# Patient Record
Sex: Female | Born: 1935 | Race: White | Hispanic: No | Marital: Married | Smoking: Never smoker
Health system: Southern US, Community
[De-identification: ages and names within clinical notes are randomized; demographics above are authoritative.]

## PROBLEM LIST (undated history)

## (undated) DIAGNOSIS — L719 Rosacea, unspecified: Secondary | ICD-10-CM

## (undated) DIAGNOSIS — R7303 Prediabetes: Secondary | ICD-10-CM

## (undated) DIAGNOSIS — R002 Palpitations: Secondary | ICD-10-CM

## (undated) DIAGNOSIS — M8580 Other specified disorders of bone density and structure, unspecified site: Secondary | ICD-10-CM

## (undated) DIAGNOSIS — E785 Hyperlipidemia, unspecified: Secondary | ICD-10-CM

## (undated) DIAGNOSIS — I10 Essential (primary) hypertension: Secondary | ICD-10-CM

## (undated) DIAGNOSIS — M4005 Postural kyphosis, thoracolumbar region: Secondary | ICD-10-CM

## (undated) DIAGNOSIS — R609 Edema, unspecified: Secondary | ICD-10-CM

## (undated) DIAGNOSIS — N189 Chronic kidney disease, unspecified: Secondary | ICD-10-CM

## (undated) DIAGNOSIS — H409 Unspecified glaucoma: Secondary | ICD-10-CM

## (undated) DIAGNOSIS — M47816 Spondylosis without myelopathy or radiculopathy, lumbar region: Secondary | ICD-10-CM

## (undated) DIAGNOSIS — E559 Vitamin D deficiency, unspecified: Secondary | ICD-10-CM

## (undated) DIAGNOSIS — N200 Calculus of kidney: Secondary | ICD-10-CM

## (undated) HISTORY — DX: Spondylosis without myelopathy or radiculopathy, lumbar region: M47.816

## (undated) HISTORY — DX: Essential (primary) hypertension: I10

## (undated) HISTORY — DX: Prediabetes: R73.03

## (undated) HISTORY — DX: Palpitations: R00.2

## (undated) HISTORY — DX: Unspecified glaucoma: H40.9

## (undated) HISTORY — DX: Postural kyphosis, thoracolumbar region: M40.05

## (undated) HISTORY — DX: Hyperlipidemia, unspecified: E78.5

## (undated) HISTORY — DX: Vitamin D deficiency, unspecified: E55.9

## (undated) HISTORY — DX: Edema, unspecified: R60.9

## (undated) HISTORY — DX: Calculus of kidney: N20.0

## (undated) HISTORY — PX: ABDOMINAL HYSTERECTOMY: SHX81

## (undated) HISTORY — DX: Chronic kidney disease, unspecified: N18.9

## (undated) HISTORY — DX: Other specified disorders of bone density and structure, unspecified site: M85.80

## (undated) HISTORY — DX: Rosacea, unspecified: L71.9

---

## 2003-08-09 ENCOUNTER — Other Ambulatory Visit: Admission: RE | Admit: 2003-08-09 | Discharge: 2003-08-09 | Payer: Self-pay | Admitting: *Deleted

## 2004-10-09 ENCOUNTER — Other Ambulatory Visit: Admission: RE | Admit: 2004-10-09 | Discharge: 2004-10-09 | Payer: Self-pay | Admitting: *Deleted

## 2006-02-10 HISTORY — PX: COLON SURGERY: SHX602

## 2006-10-07 ENCOUNTER — Other Ambulatory Visit: Admission: RE | Admit: 2006-10-07 | Discharge: 2006-10-07 | Payer: Self-pay | Admitting: *Deleted

## 2009-11-23 ENCOUNTER — Encounter: Payer: Self-pay | Admitting: Cardiology

## 2009-12-18 ENCOUNTER — Ambulatory Visit: Payer: Self-pay

## 2009-12-18 ENCOUNTER — Ambulatory Visit: Payer: Self-pay | Admitting: Cardiology

## 2009-12-18 DIAGNOSIS — R9430 Abnormal result of cardiovascular function study, unspecified: Secondary | ICD-10-CM | POA: Insufficient documentation

## 2010-01-07 ENCOUNTER — Telehealth (INDEPENDENT_AMBULATORY_CARE_PROVIDER_SITE_OTHER): Payer: Self-pay | Admitting: *Deleted

## 2010-01-08 ENCOUNTER — Ambulatory Visit: Payer: Self-pay

## 2010-01-08 ENCOUNTER — Ambulatory Visit: Payer: Self-pay | Admitting: Cardiology

## 2010-01-08 ENCOUNTER — Ambulatory Visit (HOSPITAL_COMMUNITY): Admission: RE | Admit: 2010-01-08 | Discharge: 2010-01-08 | Payer: Self-pay | Admitting: Cardiology

## 2010-01-08 ENCOUNTER — Encounter: Payer: Self-pay | Admitting: Cardiology

## 2010-03-14 NOTE — Progress Notes (Signed)
Summary: Stress Echo pre procedure  Phone Note Outgoing Call Call back at Home Phone [phone]   Call placed by: [employer]a [employer], CMA,  January 07, 2010 4:51 PM Call placed to: Patient Summary of Call: Called and left message with instructions for Stress Echo on 01/08/10 @ 8:30 a.m. There is no 919 area code for [place], [place].

## 2010-05-09 NOTE — Letter (Signed)
Summary: [facility] Office Visit Note   [facility] Office Visit Note   Imported By: Roderic Ovens 05/01/2010 11:07:59  _____________________________________________________________________  External Attachment:    Type:   Image     Comment:   External Document

## 2011-05-27 DIAGNOSIS — Z79899 Other long term (current) drug therapy: Secondary | ICD-10-CM | POA: Diagnosis not present

## 2011-05-27 DIAGNOSIS — I10 Essential (primary) hypertension: Secondary | ICD-10-CM | POA: Diagnosis not present

## 2011-05-27 DIAGNOSIS — E7800 Pure hypercholesterolemia, unspecified: Secondary | ICD-10-CM | POA: Diagnosis not present

## 2011-05-27 DIAGNOSIS — R7309 Other abnormal glucose: Secondary | ICD-10-CM | POA: Diagnosis not present

## 2011-05-27 DIAGNOSIS — R609 Edema, unspecified: Secondary | ICD-10-CM | POA: Diagnosis not present

## 2011-11-04 DIAGNOSIS — R002 Palpitations: Secondary | ICD-10-CM | POA: Diagnosis not present

## 2011-11-04 DIAGNOSIS — I10 Essential (primary) hypertension: Secondary | ICD-10-CM | POA: Diagnosis not present

## 2011-11-22 DIAGNOSIS — H5200 Hypermetropia, unspecified eye: Secondary | ICD-10-CM | POA: Diagnosis not present

## 2011-11-22 DIAGNOSIS — H04129 Dry eye syndrome of unspecified lacrimal gland: Secondary | ICD-10-CM | POA: Diagnosis not present

## 2011-11-26 DIAGNOSIS — E7800 Pure hypercholesterolemia, unspecified: Secondary | ICD-10-CM | POA: Diagnosis not present

## 2011-11-26 DIAGNOSIS — I10 Essential (primary) hypertension: Secondary | ICD-10-CM | POA: Diagnosis not present

## 2011-11-26 DIAGNOSIS — H6120 Impacted cerumen, unspecified ear: Secondary | ICD-10-CM | POA: Diagnosis not present

## 2011-11-26 DIAGNOSIS — Z23 Encounter for immunization: Secondary | ICD-10-CM | POA: Diagnosis not present

## 2011-12-22 DIAGNOSIS — Z1231 Encounter for screening mammogram for malignant neoplasm of breast: Secondary | ICD-10-CM | POA: Diagnosis not present

## 2011-12-24 DIAGNOSIS — Z1212 Encounter for screening for malignant neoplasm of rectum: Secondary | ICD-10-CM | POA: Diagnosis not present

## 2011-12-24 DIAGNOSIS — Z124 Encounter for screening for malignant neoplasm of cervix: Secondary | ICD-10-CM | POA: Diagnosis not present

## 2011-12-24 DIAGNOSIS — Z01419 Encounter for gynecological examination (general) (routine) without abnormal findings: Secondary | ICD-10-CM | POA: Diagnosis not present

## 2011-12-24 DIAGNOSIS — Z130 Encounter for screening for diseases of the blood and blood-forming organs and certain disorders involving the immune mechanism: Secondary | ICD-10-CM | POA: Diagnosis not present

## 2012-05-26 DIAGNOSIS — R7309 Other abnormal glucose: Secondary | ICD-10-CM | POA: Diagnosis not present

## 2012-05-26 DIAGNOSIS — E7800 Pure hypercholesterolemia, unspecified: Secondary | ICD-10-CM | POA: Diagnosis not present

## 2012-05-26 DIAGNOSIS — Z1331 Encounter for screening for depression: Secondary | ICD-10-CM | POA: Diagnosis not present

## 2012-05-26 DIAGNOSIS — Z9181 History of falling: Secondary | ICD-10-CM | POA: Diagnosis not present

## 2012-05-26 DIAGNOSIS — Z6834 Body mass index (BMI) 34.0-34.9, adult: Secondary | ICD-10-CM | POA: Diagnosis not present

## 2012-05-26 DIAGNOSIS — I10 Essential (primary) hypertension: Secondary | ICD-10-CM | POA: Diagnosis not present

## 2012-11-01 DIAGNOSIS — Z86010 Personal history of colonic polyps: Secondary | ICD-10-CM | POA: Diagnosis not present

## 2012-11-01 DIAGNOSIS — K591 Functional diarrhea: Secondary | ICD-10-CM | POA: Diagnosis not present

## 2012-11-01 DIAGNOSIS — K5730 Diverticulosis of large intestine without perforation or abscess without bleeding: Secondary | ICD-10-CM | POA: Diagnosis not present

## 2012-11-01 DIAGNOSIS — Z800 Family history of malignant neoplasm of digestive organs: Secondary | ICD-10-CM | POA: Diagnosis not present

## 2012-11-25 DIAGNOSIS — Z1211 Encounter for screening for malignant neoplasm of colon: Secondary | ICD-10-CM | POA: Diagnosis not present

## 2012-11-25 DIAGNOSIS — D126 Benign neoplasm of colon, unspecified: Secondary | ICD-10-CM | POA: Diagnosis not present

## 2012-11-25 DIAGNOSIS — Z800 Family history of malignant neoplasm of digestive organs: Secondary | ICD-10-CM | POA: Diagnosis not present

## 2012-11-25 DIAGNOSIS — K5730 Diverticulosis of large intestine without perforation or abscess without bleeding: Secondary | ICD-10-CM | POA: Diagnosis not present

## 2012-11-25 DIAGNOSIS — K644 Residual hemorrhoidal skin tags: Secondary | ICD-10-CM | POA: Diagnosis not present

## 2012-11-25 DIAGNOSIS — D649 Anemia, unspecified: Secondary | ICD-10-CM | POA: Diagnosis not present

## 2012-11-25 DIAGNOSIS — Z980 Intestinal bypass and anastomosis status: Secondary | ICD-10-CM | POA: Diagnosis not present

## 2012-11-25 DIAGNOSIS — I10 Essential (primary) hypertension: Secondary | ICD-10-CM | POA: Diagnosis not present

## 2012-12-01 DIAGNOSIS — E7800 Pure hypercholesterolemia, unspecified: Secondary | ICD-10-CM | POA: Diagnosis not present

## 2012-12-01 DIAGNOSIS — Z23 Encounter for immunization: Secondary | ICD-10-CM | POA: Diagnosis not present

## 2012-12-01 DIAGNOSIS — I10 Essential (primary) hypertension: Secondary | ICD-10-CM | POA: Diagnosis not present

## 2012-12-01 DIAGNOSIS — R7309 Other abnormal glucose: Secondary | ICD-10-CM | POA: Diagnosis not present

## 2012-12-04 DIAGNOSIS — H5200 Hypermetropia, unspecified eye: Secondary | ICD-10-CM | POA: Diagnosis not present

## 2012-12-04 DIAGNOSIS — H04129 Dry eye syndrome of unspecified lacrimal gland: Secondary | ICD-10-CM | POA: Diagnosis not present

## 2012-12-23 DIAGNOSIS — Z1231 Encounter for screening mammogram for malignant neoplasm of breast: Secondary | ICD-10-CM | POA: Diagnosis not present

## 2012-12-23 DIAGNOSIS — M899 Disorder of bone, unspecified: Secondary | ICD-10-CM | POA: Diagnosis not present

## 2013-06-02 DIAGNOSIS — I10 Essential (primary) hypertension: Secondary | ICD-10-CM | POA: Diagnosis not present

## 2013-06-02 DIAGNOSIS — R7309 Other abnormal glucose: Secondary | ICD-10-CM | POA: Diagnosis not present

## 2013-06-02 DIAGNOSIS — N183 Chronic kidney disease, stage 3 unspecified: Secondary | ICD-10-CM | POA: Diagnosis not present

## 2013-06-02 DIAGNOSIS — Z68.41 Body mass index (BMI) pediatric, greater than or equal to 95th percentile for age: Secondary | ICD-10-CM | POA: Diagnosis not present

## 2013-06-02 DIAGNOSIS — Z9181 History of falling: Secondary | ICD-10-CM | POA: Diagnosis not present

## 2013-06-02 DIAGNOSIS — E7800 Pure hypercholesterolemia, unspecified: Secondary | ICD-10-CM | POA: Diagnosis not present

## 2013-06-02 DIAGNOSIS — Z1331 Encounter for screening for depression: Secondary | ICD-10-CM | POA: Diagnosis not present

## 2013-09-20 DIAGNOSIS — Z1212 Encounter for screening for malignant neoplasm of rectum: Secondary | ICD-10-CM | POA: Diagnosis not present

## 2013-09-20 DIAGNOSIS — Z01419 Encounter for gynecological examination (general) (routine) without abnormal findings: Secondary | ICD-10-CM | POA: Diagnosis not present

## 2013-12-06 DIAGNOSIS — E780 Pure hypercholesterolemia: Secondary | ICD-10-CM | POA: Diagnosis not present

## 2013-12-06 DIAGNOSIS — R7309 Other abnormal glucose: Secondary | ICD-10-CM | POA: Diagnosis not present

## 2013-12-06 DIAGNOSIS — Z23 Encounter for immunization: Secondary | ICD-10-CM | POA: Diagnosis not present

## 2013-12-06 DIAGNOSIS — I10 Essential (primary) hypertension: Secondary | ICD-10-CM | POA: Diagnosis not present

## 2013-12-06 DIAGNOSIS — N183 Chronic kidney disease, stage 3 (moderate): Secondary | ICD-10-CM | POA: Diagnosis not present

## 2013-12-06 DIAGNOSIS — R609 Edema, unspecified: Secondary | ICD-10-CM | POA: Diagnosis not present

## 2014-05-29 DIAGNOSIS — Z803 Family history of malignant neoplasm of breast: Secondary | ICD-10-CM | POA: Diagnosis not present

## 2014-05-29 DIAGNOSIS — Z1231 Encounter for screening mammogram for malignant neoplasm of breast: Secondary | ICD-10-CM | POA: Diagnosis not present

## 2014-06-12 DIAGNOSIS — I10 Essential (primary) hypertension: Secondary | ICD-10-CM | POA: Diagnosis not present

## 2014-06-12 DIAGNOSIS — E780 Pure hypercholesterolemia: Secondary | ICD-10-CM | POA: Diagnosis not present

## 2014-06-12 DIAGNOSIS — Z6835 Body mass index (BMI) 35.0-35.9, adult: Secondary | ICD-10-CM | POA: Diagnosis not present

## 2014-06-12 DIAGNOSIS — R7309 Other abnormal glucose: Secondary | ICD-10-CM | POA: Diagnosis not present

## 2014-06-12 DIAGNOSIS — N183 Chronic kidney disease, stage 3 (moderate): Secondary | ICD-10-CM | POA: Diagnosis not present

## 2014-06-12 DIAGNOSIS — R609 Edema, unspecified: Secondary | ICD-10-CM | POA: Diagnosis not present

## 2014-12-13 DIAGNOSIS — E7800 Pure hypercholesterolemia, unspecified: Secondary | ICD-10-CM | POA: Diagnosis not present

## 2014-12-13 DIAGNOSIS — D485 Neoplasm of uncertain behavior of skin: Secondary | ICD-10-CM | POA: Diagnosis not present

## 2014-12-13 DIAGNOSIS — Z23 Encounter for immunization: Secondary | ICD-10-CM | POA: Diagnosis not present

## 2014-12-13 DIAGNOSIS — Z1389 Encounter for screening for other disorder: Secondary | ICD-10-CM | POA: Diagnosis not present

## 2014-12-13 DIAGNOSIS — I499 Cardiac arrhythmia, unspecified: Secondary | ICD-10-CM | POA: Diagnosis not present

## 2014-12-13 DIAGNOSIS — N183 Chronic kidney disease, stage 3 (moderate): Secondary | ICD-10-CM | POA: Diagnosis not present

## 2014-12-13 DIAGNOSIS — Z9181 History of falling: Secondary | ICD-10-CM | POA: Diagnosis not present

## 2014-12-13 DIAGNOSIS — Z6834 Body mass index (BMI) 34.0-34.9, adult: Secondary | ICD-10-CM | POA: Diagnosis not present

## 2014-12-13 DIAGNOSIS — R7303 Prediabetes: Secondary | ICD-10-CM | POA: Diagnosis not present

## 2014-12-13 DIAGNOSIS — I10 Essential (primary) hypertension: Secondary | ICD-10-CM | POA: Diagnosis not present

## 2014-12-13 DIAGNOSIS — M8580 Other specified disorders of bone density and structure, unspecified site: Secondary | ICD-10-CM | POA: Diagnosis not present

## 2014-12-26 DIAGNOSIS — D485 Neoplasm of uncertain behavior of skin: Secondary | ICD-10-CM | POA: Diagnosis not present

## 2014-12-26 DIAGNOSIS — L821 Other seborrheic keratosis: Secondary | ICD-10-CM | POA: Diagnosis not present

## 2015-02-13 DIAGNOSIS — I10 Essential (primary) hypertension: Secondary | ICD-10-CM | POA: Diagnosis not present

## 2015-02-13 DIAGNOSIS — R0789 Other chest pain: Secondary | ICD-10-CM | POA: Diagnosis not present

## 2015-02-13 DIAGNOSIS — Z6835 Body mass index (BMI) 35.0-35.9, adult: Secondary | ICD-10-CM | POA: Diagnosis not present

## 2015-02-24 DIAGNOSIS — H5203 Hypermetropia, bilateral: Secondary | ICD-10-CM | POA: Diagnosis not present

## 2015-02-24 DIAGNOSIS — H26493 Other secondary cataract, bilateral: Secondary | ICD-10-CM | POA: Diagnosis not present

## 2015-02-24 DIAGNOSIS — H04123 Dry eye syndrome of bilateral lacrimal glands: Secondary | ICD-10-CM | POA: Diagnosis not present

## 2015-03-02 ENCOUNTER — Telehealth: Payer: Self-pay | Admitting: Cardiology

## 2015-03-02 NOTE — Telephone Encounter (Signed)
Received records from [facility] for appointment with Dr Stanford Breed on 03/12/15.  Records given to [facility] (medical records) for Dr Jacalyn Lefevre schedule on 03/12/15. lp

## 2015-03-09 NOTE — Progress Notes (Signed)
HPI: 82 year old female for evaluation of chest pain. Stress echocardiogram November 2011 normal. On New Year's Eve the patient awoke and then developed pain in her left subscapular area. The pain did not radiate. No associated symptoms. Not pleuritic or positional. Lasted 6 hours and resolved after lying on her stomach. She has had no symptoms since. She has dyspnea with more extreme activities but not routine activities. No orthopnea or PND. Occasional mild pedal edema. No exertional chest pain or syncope. Because of the above we were asked to evaluate.  Current Outpatient Prescriptions  Medication Sig Dispense Refill  . amLODipine (NORVASC) 5 MG tablet Take 5 mg by mouth daily.    [name] aspirin 81 MG tablet Take 81 mg by mouth daily.    [name] atorvastatin (LIPITOR) 10 MG tablet Take 10 mg by mouth daily.    . Biotin 1000 MCG tablet Take 1,000 mcg by mouth daily.    . calcium citrate-vitamin D (CITRACAL+D) 315-200 MG-UNIT tablet Take 1 tablet by mouth 2 (two) times daily.    . Cholecalciferol (VITAMIN D-3) 1000 units CAPS Take 1 capsule by mouth daily.    . furosemide (LASIX) 40 MG tablet Take 40 mg by mouth daily as needed.     [name] losartan (COZAAR) 100 MG tablet Take 100 mg by mouth daily.    . metoprolol (LOPRESSOR) 100 MG tablet Take 100 mg by mouth daily.    . Multiple Vitamins-Minerals (MULTIVITAMIN WITH MINERALS) tablet Take 1 tablet by mouth daily.     No current facility-administered medications for this visit.    No Known Allergies   Past Medical History  Diagnosis Date  . Chronic kidney disease     stage 3  . Hypertension   . Vitamin D deficiency   . Rosacea   . Edema   . Pre-diabetes   . Hyperlipidemia   . Osteopenia   . Calculus of kidney   . Postural kyphosis of lumbar region   . Glaucoma   . Lumbar spondylosis   . Palpitations     Past Surgical History  Procedure Laterality Date  . Abdominal hysterectomy    . Colon surgery  2008    mass removed sigmoid     Social History   Social History  . Marital Status: Married    Spouse Name: N/A  . Number of Children: 2  . Years of Education: N/A   Occupational History  . Not on file.   Social History Main Topics  . Smoking status: Never Smoker   . Smokeless tobacco: Not on file  . Alcohol Use: No  . Drug Use: Not on file  . Sexual Activity: Not on file   Other Topics Concern  . Not on file   Social History Narrative    Family History  Problem Relation Age of Onset  . CVA Mother   . Cancer Father   . Cancer Sister   . Heart attack Brother   . Colon cancer Brother   . Breast cancer Sister     ROS: no fevers or chills, productive cough, hemoptysis, dysphasia, odynophagia, melena, hematochezia, dysuria, hematuria, rash, seizure activity, orthopnea, PND, pedal edema, claudication. Remaining systems are negative.  Physical Exam:   Blood pressure 156/70, pulse 60, height 5\' 4"  (1.626 m), weight 200 lb 11.2 oz (91.037 kg).  General:  Well developed/obese in NAD Skin warm/dry Patient not depressed No peripheral clubbing Back-normal HEENT-normal/normal eyelids Neck supple/normal carotid upstroke bilaterally; no bruits; no JVD; no thyromegaly  chest - CTA/ normal expansion CV - RRR/normal S1 and S2; no murmurs, rubs or gallops;  PMI nondisplaced Abdomen -NT/ND, no HSM, no mass, + bowel sounds, no bruit 2+ femoral pulses, no bruits Ext-no edema, chords, 2+ DP Neuro-grossly nonfocal  ECG 12/13/2014-sinus rhythm, cannot rule out prior inferior infarct. 02/13/2015-sinus rhythm with no ST changes.

## 2015-03-12 ENCOUNTER — Encounter: Payer: Self-pay | Admitting: Cardiology

## 2015-03-12 ENCOUNTER — Ambulatory Visit (INDEPENDENT_AMBULATORY_CARE_PROVIDER_SITE_OTHER): Payer: Medicare Other | Admitting: Cardiology

## 2015-03-12 VITALS — BP 156/70 | HR 60 | Ht 64.0 in | Wt 200.7 lb

## 2015-03-12 DIAGNOSIS — I10 Essential (primary) hypertension: Secondary | ICD-10-CM | POA: Insufficient documentation

## 2015-03-12 DIAGNOSIS — E785 Hyperlipidemia, unspecified: Secondary | ICD-10-CM | POA: Diagnosis not present

## 2015-03-12 DIAGNOSIS — R079 Chest pain, unspecified: Secondary | ICD-10-CM | POA: Insufficient documentation

## 2015-03-12 DIAGNOSIS — R072 Precordial pain: Secondary | ICD-10-CM

## 2015-03-12 NOTE — Assessment & Plan Note (Signed)
Symptoms are somewhat atypical.We will plan a stress echocardiogram for risk stratification. Note she had a positive electrocardiographic response with stress previously and therefore will need imaging.

## 2015-03-12 NOTE — Patient Instructions (Signed)
Medication Instructions:   NO CHANGE  Testing/Procedures:  Your physician has requested that you have a stress echocardiogram. For further information please visit [email or web address]. Please follow instruction sheet as given.    Follow-Up:  Your physician recommends that you schedule a follow-up appointment in: AS NEEDED   Exercise Stress Echocardiogram An exercise stress echocardiogram is a heart (cardiac) test used to check the function of your heart. This test may also be called an exercise stress echocardiography or stress echo. This stress test will check how well your heart muscle and valves are working and determine if your heart muscle is getting enough blood. You will exercise on a treadmill to naturally increase or stress the functioning of your heart.  An echocardiogram uses sound waves (ultrasound) to produce an image of your heart. If your heart does not work normally, it may indicate coronary artery disease with poor coronary blood supply. The coronary arteries are the arteries that bring blood and oxygen to your heart. LET [facility] CARE PROVIDER KNOW ABOUT:  Any allergies you have.  All medicines you are taking, including vitamins, herbs, eye drops, creams, and over-the-counter medicines.  Previous problems you or members of your family have had with the use of anesthetics.  Any blood disorders you have.  Previous surgeries you have had.  Medical conditions you have.  Possibility of pregnancy, if this applies. RISKS AND COMPLICATIONS Generally, this is a safe procedure. However, as with any procedure, complications can occur. Possible complications can include:  You develop pain or pressure in the following areas:  Chest.  Jaw or neck.  Between your shoulder blades.  Radiating down your left arm.  Dizziness or lightheadedness.  Shortness of breath.  Increased or irregular heartbeat.  Nausea or vomiting.  Heart attack (rare). BEFORE THE  PROCEDURE  Avoid all forms of caffeine for 24 hours before your test or as directed by your health care provider. This includes coffee, tea (even decaffeinated tea), caffeinated sodas, chocolate, cocoa, and certain pain medicines.  Follow your health care provider's instructions regarding eating and drinking before the test.  Take your medicines as directed at regular times with water unless instructed otherwise. Exceptions may include:  If you have diabetes, ask how you are to take your insulin or pills. It is common to adjust insulin dosing the morning of the test.  If you are taking beta-blocker medicines, it is important to talk to your health care provider about these medicines well before the date of your test. Taking beta-blocker medicines may interfere with the test. In some cases, these medicines need to be changed or stopped 24 hours or more before the test.  If you wear a nitroglycerin patch, it may need to be removed prior to the test. Ask your health care provider if the patch should be removed before the test.  If you use an inhaler for any breathing condition, bring it with you to the test.  If you are an outpatient, bring a snack so you can eat right after the stress phase of the test.  Do not smoke for 4 hours prior to the test or as directed by your health care provider.  Wear loose-fitting clothes and comfortable shoes for the test. This test involves walking on a treadmill. PROCEDURE   Multiple electrodes will be put on your chest. If needed, small areas of your chest may be shaved to get better contact with the electrodes. Once the electrodes are attached to your body, multiple wires will be  attached to the electrodes, and your heart rate will be monitored.  You will have an echocardiogram done at rest.  To produce this image of your heart, gel is applied to your chest, and a wand-like tool (transducer) is moved over the chest. The transducer sends the sound waves  through the chest to create the moving images of your heart.  You may need an IV to receive a medication that improves the quality of the pictures.  You will then walk on a treadmill. The treadmill will be started at a slow pace. The treadmill speed and incline will gradually be increased to raise your heart rate.  At the peak of exercise, the treadmill will be stopped. You will lie down immediately on a bed so that a second echocardiogram can be done to visualize your heart's motion with exercise.  The test usually takes 30-60 minutes to complete. AFTER THE PROCEDURE  Your heart rate and blood pressure will be monitored after the test.  You may return to your normal schedule, including diet, activities, and medicines, unless your health care provider tells you otherwise.   This information is not intended to replace advice given to you by your health care provider. Make sure you discuss any questions you have with your health care provider.   Document Released: 02/01/2004 Document Revised: 02/01/2013 Document Reviewed: 10/04/2012 Elsevier Interactive Patient Education [employer].

## 2015-03-12 NOTE — Assessment & Plan Note (Signed)
Continue statin. 

## 2015-03-12 NOTE — Assessment & Plan Note (Signed)
Blood pressure is mildly elevated today. However she checks this routinely at home and brought readings today. It is typically controlled with a systolic in the [id number] range. Continue present medications and follow.

## 2015-03-19 ENCOUNTER — Encounter: Payer: Self-pay | Admitting: Cardiology

## 2015-03-29 ENCOUNTER — Ambulatory Visit (HOSPITAL_COMMUNITY): Payer: Medicare Other | Attending: Cardiology

## 2015-03-29 ENCOUNTER — Other Ambulatory Visit (HOSPITAL_COMMUNITY): Payer: Medicare Other

## 2015-03-29 ENCOUNTER — Ambulatory Visit (HOSPITAL_COMMUNITY): Payer: Medicare Other

## 2015-03-29 DIAGNOSIS — R072 Precordial pain: Secondary | ICD-10-CM | POA: Diagnosis not present

## 2015-03-29 DIAGNOSIS — Z8249 Family history of ischemic heart disease and other diseases of the circulatory system: Secondary | ICD-10-CM | POA: Insufficient documentation

## 2015-03-29 DIAGNOSIS — E785 Hyperlipidemia, unspecified: Secondary | ICD-10-CM | POA: Insufficient documentation

## 2015-03-29 DIAGNOSIS — N189 Chronic kidney disease, unspecified: Secondary | ICD-10-CM | POA: Insufficient documentation

## 2015-03-29 DIAGNOSIS — R079 Chest pain, unspecified: Secondary | ICD-10-CM | POA: Diagnosis present

## 2015-05-01 DIAGNOSIS — H26491 Other secondary cataract, right eye: Secondary | ICD-10-CM | POA: Diagnosis not present

## 2015-05-29 DIAGNOSIS — H26492 Other secondary cataract, left eye: Secondary | ICD-10-CM | POA: Diagnosis not present

## 2015-06-14 DIAGNOSIS — R5383 Other fatigue: Secondary | ICD-10-CM | POA: Diagnosis not present

## 2015-06-14 DIAGNOSIS — E7800 Pure hypercholesterolemia, unspecified: Secondary | ICD-10-CM | POA: Diagnosis not present

## 2015-06-14 DIAGNOSIS — G4762 Sleep related leg cramps: Secondary | ICD-10-CM | POA: Diagnosis not present

## 2015-06-14 DIAGNOSIS — N183 Chronic kidney disease, stage 3 (moderate): Secondary | ICD-10-CM | POA: Diagnosis not present

## 2015-06-14 DIAGNOSIS — I10 Essential (primary) hypertension: Secondary | ICD-10-CM | POA: Diagnosis not present

## 2015-06-14 DIAGNOSIS — Z6835 Body mass index (BMI) 35.0-35.9, adult: Secondary | ICD-10-CM | POA: Diagnosis not present

## 2015-06-14 DIAGNOSIS — R7303 Prediabetes: Secondary | ICD-10-CM | POA: Diagnosis not present

## 2015-06-14 DIAGNOSIS — E669 Obesity, unspecified: Secondary | ICD-10-CM | POA: Diagnosis not present

## 2015-06-14 DIAGNOSIS — E559 Vitamin D deficiency, unspecified: Secondary | ICD-10-CM | POA: Diagnosis not present

## 2015-06-21 DIAGNOSIS — M8589 Other specified disorders of bone density and structure, multiple sites: Secondary | ICD-10-CM | POA: Diagnosis not present

## 2015-06-21 DIAGNOSIS — Z1231 Encounter for screening mammogram for malignant neoplasm of breast: Secondary | ICD-10-CM | POA: Diagnosis not present

## 2015-06-21 DIAGNOSIS — Z8262 Family history of osteoporosis: Secondary | ICD-10-CM | POA: Diagnosis not present

## 2015-08-08 DIAGNOSIS — M549 Dorsalgia, unspecified: Secondary | ICD-10-CM | POA: Diagnosis not present

## 2015-08-08 DIAGNOSIS — Z6835 Body mass index (BMI) 35.0-35.9, adult: Secondary | ICD-10-CM | POA: Diagnosis not present

## 2015-08-10 DIAGNOSIS — B029 Zoster without complications: Secondary | ICD-10-CM | POA: Diagnosis not present

## 2015-08-10 DIAGNOSIS — Z6835 Body mass index (BMI) 35.0-35.9, adult: Secondary | ICD-10-CM | POA: Diagnosis not present

## 2015-09-06 DIAGNOSIS — B0229 Other postherpetic nervous system involvement: Secondary | ICD-10-CM | POA: Diagnosis not present

## 2015-09-06 DIAGNOSIS — Z6834 Body mass index (BMI) 34.0-34.9, adult: Secondary | ICD-10-CM | POA: Diagnosis not present

## 2015-10-09 DIAGNOSIS — Z23 Encounter for immunization: Secondary | ICD-10-CM | POA: Diagnosis not present

## 2015-10-09 DIAGNOSIS — B0229 Other postherpetic nervous system involvement: Secondary | ICD-10-CM | POA: Diagnosis not present

## 2015-10-09 DIAGNOSIS — Z6835 Body mass index (BMI) 35.0-35.9, adult: Secondary | ICD-10-CM | POA: Diagnosis not present

## 2015-11-21 DIAGNOSIS — Z23 Encounter for immunization: Secondary | ICD-10-CM | POA: Diagnosis not present

## 2015-11-21 DIAGNOSIS — Z6834 Body mass index (BMI) 34.0-34.9, adult: Secondary | ICD-10-CM | POA: Diagnosis not present

## 2015-11-21 DIAGNOSIS — B0229 Other postherpetic nervous system involvement: Secondary | ICD-10-CM | POA: Diagnosis not present

## 2015-12-03 DIAGNOSIS — Z800 Family history of malignant neoplasm of digestive organs: Secondary | ICD-10-CM | POA: Diagnosis not present

## 2015-12-03 DIAGNOSIS — K5730 Diverticulosis of large intestine without perforation or abscess without bleeding: Secondary | ICD-10-CM | POA: Diagnosis not present

## 2015-12-07 DIAGNOSIS — Z01419 Encounter for gynecological examination (general) (routine) without abnormal findings: Secondary | ICD-10-CM | POA: Diagnosis not present

## 2015-12-07 DIAGNOSIS — Z6834 Body mass index (BMI) 34.0-34.9, adult: Secondary | ICD-10-CM | POA: Diagnosis not present

## 2015-12-18 DIAGNOSIS — I10 Essential (primary) hypertension: Secondary | ICD-10-CM | POA: Diagnosis not present

## 2015-12-18 DIAGNOSIS — E559 Vitamin D deficiency, unspecified: Secondary | ICD-10-CM | POA: Diagnosis not present

## 2015-12-18 DIAGNOSIS — M8580 Other specified disorders of bone density and structure, unspecified site: Secondary | ICD-10-CM | POA: Diagnosis not present

## 2015-12-18 DIAGNOSIS — Z1389 Encounter for screening for other disorder: Secondary | ICD-10-CM | POA: Diagnosis not present

## 2015-12-18 DIAGNOSIS — E7800 Pure hypercholesterolemia, unspecified: Secondary | ICD-10-CM | POA: Diagnosis not present

## 2015-12-18 DIAGNOSIS — N183 Chronic kidney disease, stage 3 (moderate): Secondary | ICD-10-CM | POA: Diagnosis not present

## 2015-12-18 DIAGNOSIS — Z6834 Body mass index (BMI) 34.0-34.9, adult: Secondary | ICD-10-CM | POA: Diagnosis not present

## 2015-12-18 DIAGNOSIS — Z9181 History of falling: Secondary | ICD-10-CM | POA: Diagnosis not present

## 2015-12-18 DIAGNOSIS — R7303 Prediabetes: Secondary | ICD-10-CM | POA: Diagnosis not present

## 2015-12-18 DIAGNOSIS — B0229 Other postherpetic nervous system involvement: Secondary | ICD-10-CM | POA: Diagnosis not present

## 2016-01-01 DIAGNOSIS — Z86010 Personal history of colonic polyps: Secondary | ICD-10-CM | POA: Diagnosis not present

## 2016-01-01 DIAGNOSIS — D122 Benign neoplasm of ascending colon: Secondary | ICD-10-CM | POA: Diagnosis not present

## 2016-01-01 DIAGNOSIS — K5730 Diverticulosis of large intestine without perforation or abscess without bleeding: Secondary | ICD-10-CM | POA: Diagnosis not present

## 2016-01-01 DIAGNOSIS — D126 Benign neoplasm of colon, unspecified: Secondary | ICD-10-CM | POA: Diagnosis not present

## 2016-01-01 DIAGNOSIS — K635 Polyp of colon: Secondary | ICD-10-CM | POA: Diagnosis not present

## 2016-06-19 DIAGNOSIS — R7303 Prediabetes: Secondary | ICD-10-CM | POA: Diagnosis not present

## 2016-06-19 DIAGNOSIS — I10 Essential (primary) hypertension: Secondary | ICD-10-CM | POA: Diagnosis not present

## 2016-06-19 DIAGNOSIS — Z23 Encounter for immunization: Secondary | ICD-10-CM | POA: Diagnosis not present

## 2016-06-19 DIAGNOSIS — Z6835 Body mass index (BMI) 35.0-35.9, adult: Secondary | ICD-10-CM | POA: Diagnosis not present

## 2016-06-19 DIAGNOSIS — N183 Chronic kidney disease, stage 3 (moderate): Secondary | ICD-10-CM | POA: Diagnosis not present

## 2016-06-19 DIAGNOSIS — E7800 Pure hypercholesterolemia, unspecified: Secondary | ICD-10-CM | POA: Diagnosis not present

## 2016-06-19 DIAGNOSIS — B0229 Other postherpetic nervous system involvement: Secondary | ICD-10-CM | POA: Diagnosis not present

## 2016-07-08 DIAGNOSIS — L255 Unspecified contact dermatitis due to plants, except food: Secondary | ICD-10-CM | POA: Diagnosis not present

## 2016-12-29 DIAGNOSIS — G250 Essential tremor: Secondary | ICD-10-CM | POA: Diagnosis not present

## 2016-12-29 DIAGNOSIS — R609 Edema, unspecified: Secondary | ICD-10-CM | POA: Diagnosis not present

## 2016-12-29 DIAGNOSIS — R7303 Prediabetes: Secondary | ICD-10-CM | POA: Diagnosis not present

## 2016-12-29 DIAGNOSIS — Z6835 Body mass index (BMI) 35.0-35.9, adult: Secondary | ICD-10-CM | POA: Diagnosis not present

## 2016-12-29 DIAGNOSIS — I10 Essential (primary) hypertension: Secondary | ICD-10-CM | POA: Diagnosis not present

## 2016-12-29 DIAGNOSIS — E7800 Pure hypercholesterolemia, unspecified: Secondary | ICD-10-CM | POA: Diagnosis not present

## 2016-12-29 DIAGNOSIS — H6123 Impacted cerumen, bilateral: Secondary | ICD-10-CM | POA: Diagnosis not present

## 2016-12-29 DIAGNOSIS — N183 Chronic kidney disease, stage 3 (moderate): Secondary | ICD-10-CM | POA: Diagnosis not present

## 2017-01-27 ENCOUNTER — Encounter: Payer: Self-pay | Admitting: *Deleted

## 2017-02-04 ENCOUNTER — Encounter: Payer: Self-pay | Admitting: Physician Assistant

## 2017-02-04 ENCOUNTER — Ambulatory Visit (INDEPENDENT_AMBULATORY_CARE_PROVIDER_SITE_OTHER): Payer: Medicare Other | Admitting: Physician Assistant

## 2017-02-04 VITALS — BP 140/60 | HR 67 | Ht 65.0 in | Wt 197.0 lb

## 2017-02-04 DIAGNOSIS — N183 Chronic kidney disease, stage 3 unspecified: Secondary | ICD-10-CM

## 2017-02-04 DIAGNOSIS — I10 Essential (primary) hypertension: Secondary | ICD-10-CM

## 2017-02-04 DIAGNOSIS — R079 Chest pain, unspecified: Secondary | ICD-10-CM | POA: Diagnosis not present

## 2017-02-04 DIAGNOSIS — R002 Palpitations: Secondary | ICD-10-CM | POA: Diagnosis not present

## 2017-02-04 DIAGNOSIS — E785 Hyperlipidemia, unspecified: Secondary | ICD-10-CM

## 2017-02-04 NOTE — Patient Instructions (Signed)
Medication Instructions:   No changes  Labwork:   none  Testing/Procedures:  Your physician has requested that you have an echocardiogram. Echocardiography is a painless test that uses sound waves to create images of your heart. It provides your doctor with information about the size and shape of your heart and how well your heart's chambers and valves are working. This procedure takes approximately one hour. There are no restrictions for this procedure.   Follow-Up:  In 3 months with Dr. Stanford Breed  Call our office sooner if you have recurrent chest pain.   If you need a refill on your cardiac medications before your next appointment, please call your pharmacy.

## 2017-02-04 NOTE — Progress Notes (Signed)
Cardiology Office Note    Date:  02/06/2017   ID:  ADLEE PAAR, DOB Jun 24, 1931, MRN [id number]  PCP:  Cyndi Bender, PA-C  Cardiologist:  Dr. Stanford Breed  Chief Complaint  Patient presents with  . Follow-up    seen for Dr. Stanford Breed.    History of Present Illness:  Bridget Evans is a 83 y.o. female with PMH of CKD stage III, HTN, HLD and h/o palpitation.  She had a stress echo in November 2011 that was normal.  She developed chest pain on New Year's Eve and was seen in January 2017.  Subsequent repeat stress echo obtained on 03/29/2015 showed 1 mm J-point depression with upsloping ST segment depression at peak exercise, no angina, subsequent staged echo was normal, overall considered a normal study.  It was recommended for her to follow-up on as-needed basis.  Patient presents today for cardiology office visit.  She had another episode of chest tightness and palpitation.  This symptom is very similar to what she experienced before New Year 2017 prior to her normal stress echo.  The symptoms occurred at rest and lasted several hours before going away.  EKG obtained today continue to show chronic Q waves in lead III however no other ST-T wave abnormality.  She does have PACs on EKG.  After the episode of chest pain, she had weakness for a few days, the symptom eventually resolved and has not recurred since.  So far she only had one episode of chest pain and palpitation.  Since then she has been able to walk around without any issue.  She says she was under a lot of stress planning for family Christmas get together at the time when she has this symptom.  Given the reassuring stress echo in 2017 and solitary episode of symptom with no recurrence for the past 2-1/2 weeks, I recommended a regular echocardiogram to establish baseline function of the heart.  As long as echocardiogram shows normal pumping function without significant wall motion abnormality, I think it would be more reasonable to  observe for recurrence.  I did recommend her to increase activity level to see if she has any symptoms.  She has been instructed to contact cardiology if she does have recurrence of chest discomfort, if so she will need a stress test.   Past Medical History:  Diagnosis Date  . Calculus of kidney   . Chronic kidney disease    stage 3  . Edema   . Glaucoma   . Hyperlipidemia   . Hypertension   . Lumbar spondylosis   . Osteopenia   . Palpitations   . Postural kyphosis of lumbar region   . Pre-diabetes   . Rosacea   . Vitamin D deficiency     Past Surgical History:  Procedure Laterality Date  . ABDOMINAL HYSTERECTOMY    . COLON SURGERY  2008   mass removed sigmoid    Current Medications: Outpatient Medications Prior to Visit  Medication Sig Dispense Refill  . amLODipine (NORVASC) 5 MG tablet Take 5 mg by mouth daily.    [name] aspirin 81 MG tablet Take 81 mg by mouth daily.    [name] atorvastatin (LIPITOR) 10 MG tablet Take 10 mg by mouth daily.    . Biotin 1000 MCG tablet Take 1,000 mcg by mouth daily.    . calcium citrate-vitamin D (CITRACAL+D) 315-200 MG-UNIT tablet Take 1 tablet by mouth 2 (two) times daily.    . Cholecalciferol (VITAMIN D-3) 1000 units CAPS Take  1 capsule by mouth daily.    . furosemide (LASIX) 40 MG tablet Take 40 mg by mouth daily as needed.     [name] losartan (COZAAR) 100 MG tablet Take 100 mg by mouth daily.    . metoprolol (LOPRESSOR) 100 MG tablet Take 100 mg by mouth daily.    . Multiple Vitamins-Minerals (MULTIVITAMIN WITH MINERALS) tablet Take 1 tablet by mouth daily.     No facility-administered medications prior to visit.      Allergies:   Patient has no known allergies.   Social History   Socioeconomic History  . Marital status: Married    Spouse name: None  . Number of children: 2  . Years of education: None  . Highest education level: None  Social Needs  . Financial resource strain: None  . Food insecurity - worry: None  . Food insecurity -  inability: None  . Transportation needs - medical: None  . Transportation needs - non-medical: None  Occupational History  . None  Tobacco Use  . Smoking status: Never Smoker  . Smokeless tobacco: Never Used  Substance and Sexual Activity  . Alcohol use: No    Alcohol/week: 0.0 oz  . Drug use: None  . Sexual activity: None  Other Topics Concern  . None  Social History Narrative  . None     Family History:  The patient's family history includes Breast cancer in her sister; CVA in her mother; Cancer in her father and sister; Colon cancer in her brother; Heart attack in her brother.   ROS:   Please see the history of present illness.    ROS All other systems reviewed and are negative.   PHYSICAL EXAM:   VS:  BP 140/60   Pulse 67   Ht 5\' 5"  (1.651 m)   Wt 197 lb (89.4 kg)   BMI 32.78 kg/m    GEN: Well nourished, well developed, in no acute distress  HEENT: normal  Neck: no JVD, carotid bruits, or masses Cardiac: RRR; no murmurs, rubs, or gallops,no edema  Respiratory:  clear to auscultation bilaterally, normal work of breathing GI: soft, nontender, nondistended, + BS MS: no deformity or atrophy  Skin: warm and dry, no rash Neuro:  Alert and Oriented x 3, Strength and sensation are intact Psych: euthymic mood, full affect  Wt Readings from Last 3 Encounters:  02/04/17 197 lb (89.4 kg)  03/12/15 200 lb 11.2 oz (91 kg)      Studies/Labs Reviewed:   EKG:  EKG is ordered today.  The ekg ordered today demonstrates normal sinus rhythm with PACs  Recent Labs: No results found for requested labs within last 8760 hours.   Lipid Panel No results found for: CHOL, TRIG, HDL, CHOLHDL, VLDL, LDLCALC, LDLDIRECT  Additional studies/ records that were reviewed today include:   Stress echo 03/29/2015 - Stress ECG conclusions: The stress ECG was non-diagnostic. [facility]   scoring: exercise time of 4.5 min; 92mm of J point depression with   upsloping ST segment depression at peak  exercise; 1 mm of   horizontal ST segment depression which occurred in recovery; no   angina; resulting score is -1. This score predicts a moderate   risk of cardiac events. - Staged echo: Normal echo stress  Impressions:  - Normal study after maximal exercise.   ASSESSMENT:    1. Chest pain, unspecified type   2. Palpitation   3. Hypertension, essential   4. Hyperlipidemia, unspecified hyperlipidemia type   5. CKD (chronic  kidney disease), stage III ([facility])      PLAN:  In order of problems listed above:  1. Chest pain: Solitary episode of chest pain 2 weeks ago without further recurrence.  This is similar to her chest pain prior to the last years stress echo.  I will obtain a regular echo, as long as chest pain does not recur, I do not think we need to do any additional study.  The chest pain was associated with palpitation.  2. Hypertension: Pressure stable   3. Hyperlipidemia: On Lipitor 10 mg daily.  Will defer to primary care provider to obtain annual lipid panel  4. CKD stage III: Continue current therapy, Lasix only on a as needed basis.    Medication Adjustments/Labs and Tests Ordered: Current medicines are reviewed at length with the patient today.  Concerns regarding medicines are outlined above.  Medication changes, Labs and Tests ordered today are listed in the Patient Instructions below. Patient Instructions  Medication Instructions:   No changes  Labwork:   none  Testing/Procedures:  Your physician has requested that you have an echocardiogram. Echocardiography is a painless test that uses sound waves to create images of your heart. It provides your doctor with information about the size and shape of your heart and how well your heart's chambers and valves are working. This procedure takes approximately one hour. There are no restrictions for this procedure.   Follow-Up:  In 3 months with Dr. Stanford Breed  Call our office sooner if you have recurrent  chest pain.   If you need a refill on your cardiac medications before your next appointment, please call your pharmacy.    Hilbert Corrigan, [place]  02/06/2017 7:56 AM    [address], [place], [place]  [address] Phone: [phone]; Fax: [phone]

## 2017-02-12 ENCOUNTER — Other Ambulatory Visit: Payer: Self-pay

## 2017-02-12 ENCOUNTER — Ambulatory Visit (HOSPITAL_COMMUNITY): Payer: Medicare Other | Attending: Cardiology

## 2017-02-12 DIAGNOSIS — R079 Chest pain, unspecified: Secondary | ICD-10-CM | POA: Insufficient documentation

## 2017-02-12 DIAGNOSIS — E785 Hyperlipidemia, unspecified: Secondary | ICD-10-CM | POA: Insufficient documentation

## 2017-02-12 DIAGNOSIS — I77810 Thoracic aortic ectasia: Secondary | ICD-10-CM | POA: Diagnosis not present

## 2017-02-12 DIAGNOSIS — I119 Hypertensive heart disease without heart failure: Secondary | ICD-10-CM | POA: Diagnosis not present

## 2017-02-12 DIAGNOSIS — R002 Palpitations: Secondary | ICD-10-CM | POA: Diagnosis not present

## 2017-02-12 DIAGNOSIS — I083 Combined rheumatic disorders of mitral, aortic and tricuspid valves: Secondary | ICD-10-CM | POA: Insufficient documentation

## 2017-02-18 ENCOUNTER — Other Ambulatory Visit: Payer: Self-pay | Admitting: Physician Assistant

## 2017-02-18 NOTE — Progress Notes (Signed)
Normal pumping function of heart, mild leakage in aortic and mitral valve which does not need to be fixed. There does appears to be some aneurysmal enlargement of her aorta. Recommend "CT angio of chest aorta" to further assess.

## 2017-02-25 ENCOUNTER — Telehealth: Payer: Self-pay

## 2017-02-25 DIAGNOSIS — I712 Thoracic aortic aneurysm, without rupture, unspecified: Secondary | ICD-10-CM

## 2017-02-25 DIAGNOSIS — I7789 Other specified disorders of arteries and arterioles: Secondary | ICD-10-CM

## 2017-02-25 DIAGNOSIS — I719 Aortic aneurysm of unspecified site, without rupture: Secondary | ICD-10-CM

## 2017-02-25 NOTE — Telephone Encounter (Signed)
-----   Message from [place], [place] sent at 02/18/2017  5:50 PM EST ----- Normal pumping function of heart, mild leakage in aortic and mitral valve which does not need to be fixed. There does appears to be some aneurysmal enlargement of her aorta. Recommend "CT angio of chest aorta" to further assess.

## 2017-02-25 NOTE — Telephone Encounter (Signed)
Patient directly notified; informed her that the test would be sent to the pre-cert pool and then if test is approved a scheduler will contact her to set up test.

## 2017-04-28 ENCOUNTER — Other Ambulatory Visit: Payer: Self-pay | Admitting: *Deleted

## 2017-04-28 DIAGNOSIS — Z01812 Encounter for preprocedural laboratory examination: Secondary | ICD-10-CM

## 2017-04-28 DIAGNOSIS — Z79899 Other long term (current) drug therapy: Secondary | ICD-10-CM

## 2017-04-29 ENCOUNTER — Other Ambulatory Visit: Payer: Medicare Other | Admitting: *Deleted

## 2017-04-29 ENCOUNTER — Other Ambulatory Visit: Payer: Self-pay | Admitting: Cardiology

## 2017-04-29 DIAGNOSIS — Z79899 Other long term (current) drug therapy: Secondary | ICD-10-CM | POA: Diagnosis not present

## 2017-04-29 DIAGNOSIS — Z01812 Encounter for preprocedural laboratory examination: Secondary | ICD-10-CM | POA: Diagnosis not present

## 2017-04-29 NOTE — Progress Notes (Signed)
HPI: FU chest pain. Stress echocardiogram 2/17 normal. Seen for CP 12/18. Echo 1/19 showed normal LV function, grade 1 DD, mild AI, moderately dilated aortic root, mild MR and TR. CTA 3/19 showed fusiform aneurysmal dilatation of a sending aorta measuring 5.1 cm.  There was a right upper lobe pulmonary nodule and follow-up recommended 6-12 months. Since last seen, she has mild dyspnea on exertion.  No orthopnea, PND, pedal edema or recurrent chest pain.  She does not have exertional chest pain and no syncope.  Current Outpatient Medications  Medication Sig Dispense Refill  . amLODipine (NORVASC) 5 MG tablet Take 5 mg by mouth daily.    [name] aspirin 81 MG tablet Take 81 mg by mouth daily.    [name] atorvastatin (LIPITOR) 10 MG tablet Take 10 mg by mouth daily.    . Biotin 1000 MCG tablet Take 1,000 mcg by mouth daily.    . calcium citrate-vitamin D (CITRACAL+D) 315-200 MG-UNIT tablet Take 1 tablet by mouth 2 (two) times daily.    . Cholecalciferol (VITAMIN D-3) 1000 units CAPS Take 1 capsule by mouth daily.    . furosemide (LASIX) 40 MG tablet Take 40 mg by mouth daily as needed.     [name] losartan (COZAAR) 100 MG tablet Take 100 mg by mouth daily.    . metoprolol succinate (TOPROL-XL) 100 MG 24 hr tablet Take 100 mg by mouth daily. Take with or immediately following a meal.    . Multiple Vitamins-Minerals (MULTIVITAMIN WITH MINERALS) tablet Take 1 tablet by mouth daily.     No current facility-administered medications for this visit.      Past Medical History:  Diagnosis Date  . Calculus of kidney   . Chronic kidney disease    stage 3  . Edema   . Glaucoma   . Hyperlipidemia   . Hypertension   . Lumbar spondylosis   . Osteopenia   . Palpitations   . Postural kyphosis of lumbar region   . Pre-diabetes   . Rosacea   . Vitamin D deficiency     Past Surgical History:  Procedure Laterality Date  . ABDOMINAL HYSTERECTOMY    . COLON SURGERY  2008   mass removed sigmoid    Social  History   Socioeconomic History  . Marital status: Married    Spouse name: Not on file  . Number of children: 2  . Years of education: Not on file  . Highest education level: Not on file  Occupational History  . Not on file  Social Needs  . Financial resource strain: Not on file  . Food insecurity:    Worry: Not on file    Inability: Not on file  . Transportation needs:    Medical: Not on file    Non-medical: Not on file  Tobacco Use  . Smoking status: Never Smoker  . Smokeless tobacco: Never Used  Substance and Sexual Activity  . Alcohol use: No    Alcohol/week: 0.0 oz  . Drug use: Not on file  . Sexual activity: Not on file  Lifestyle  . Physical activity:    Days per week: Not on file    Minutes per session: Not on file  . Stress: Not on file  Relationships  . Social connections:    Talks on phone: Not on file    Gets together: Not on file    Attends religious service: Not on file    Active member of club or organization: Not  on file    Attends meetings of clubs or organizations: Not on file    Relationship status: Not on file  . Intimate partner violence:    Fear of current or ex partner: Not on file    Emotionally abused: Not on file    Physically abused: Not on file    Forced sexual activity: Not on file  Other Topics Concern  . Not on file  Social History Narrative  . Not on file    Family History  Problem Relation Age of Onset  . CVA Mother   . Cancer Father   . Cancer Sister   . Heart attack Brother   . Colon cancer Brother   . Breast cancer Sister     ROS: no fevers or chills, productive cough, hemoptysis, dysphasia, odynophagia, melena, hematochezia, dysuria, hematuria, rash, seizure activity, orthopnea, PND, pedal edema, claudication. Remaining systems are negative.  Physical Exam: Well-developed well-nourished in no acute distress.  Skin is warm and dry.  HEENT is normal.  Neck is supple.  Chest is clear to auscultation with normal  expansion.  Cardiovascular exam is regular rate and rhythm.  Abdominal exam nontender or distended. No masses palpated. Extremities show no edema. neuro grossly intact   A/P  1 thoracic aortic aneurysm-we will arrange follow-up CTA September 2019.  I will ask cardiothoracic surgery to review.  2 hypertension-blood pressure is elevated.  Increase amlodipine to 10 mg daily and follow.  3 hyperlipidemia-continue statin.  4 history of chest pain-no exertional symptoms and no recurrences.  Previous stress test negative.  We will follow for now.  5 pulmonary nodule-repeat CT in 6 months.  Kirk Ruths, MD

## 2017-04-30 LAB — BASIC METABOLIC PANEL
BUN / CREAT RATIO: 21 (ref 12–28)
BUN: 17 mg/dL (ref 8–27)
CHLORIDE: 104 mmol/L (ref 96–106)
CO2: 20 mmol/L (ref 20–29)
CREATININE: 0.8 mg/dL (ref 0.57–1.00)
Calcium: 10 mg/dL (ref 8.7–10.3)
GFR calc Af Amer: 78 mL/min/{1.73_m2} (ref >59)
GFR calc non Af Amer: 68 mL/min/{1.73_m2} (ref >59)
GLUCOSE: 90 mg/dL (ref 65–99)
Potassium: 5.1 mmol/L (ref 3.5–5.2)
SODIUM: 140 mmol/L (ref 134–144)

## 2017-05-01 ENCOUNTER — Encounter: Payer: Self-pay | Admitting: *Deleted

## 2017-05-05 ENCOUNTER — Ambulatory Visit (INDEPENDENT_AMBULATORY_CARE_PROVIDER_SITE_OTHER)
Admission: RE | Admit: 2017-05-05 | Discharge: 2017-05-05 | Disposition: A | Discharge status: Home / Self Care | Payer: Medicare Other | Source: Ambulatory Visit | Attending: Physician Assistant | Admitting: Physician Assistant

## 2017-05-05 ENCOUNTER — Ambulatory Visit: Payer: Medicare Other | Admitting: Cardiology

## 2017-05-05 DIAGNOSIS — I7789 Other specified disorders of arteries and arterioles: Secondary | ICD-10-CM

## 2017-05-05 DIAGNOSIS — I700 Atherosclerosis of aorta: Secondary | ICD-10-CM | POA: Diagnosis not present

## 2017-05-05 DIAGNOSIS — I712 Thoracic aortic aneurysm, without rupture: Secondary | ICD-10-CM

## 2017-05-05 MED ORDER — IOPAMIDOL (ISOVUE-370) INJECTION 76%
100.0000 mL | Freq: Once | INTRAVENOUS | Status: AC | PRN
  Administered 2017-05-05: 100 mL via INTRAVENOUS

## 2017-05-08 ENCOUNTER — Encounter: Payer: Self-pay | Admitting: Cardiology

## 2017-05-08 ENCOUNTER — Ambulatory Visit (INDEPENDENT_AMBULATORY_CARE_PROVIDER_SITE_OTHER): Payer: Medicare Other | Admitting: Cardiology

## 2017-05-08 VITALS — BP 150/66 | HR 67 | Ht 64.0 in | Wt 203.4 lb

## 2017-05-08 DIAGNOSIS — E7800 Pure hypercholesterolemia, unspecified: Secondary | ICD-10-CM | POA: Diagnosis not present

## 2017-05-08 DIAGNOSIS — I712 Thoracic aortic aneurysm, without rupture, unspecified: Secondary | ICD-10-CM

## 2017-05-08 DIAGNOSIS — I10 Essential (primary) hypertension: Secondary | ICD-10-CM | POA: Diagnosis not present

## 2017-05-08 MED ORDER — AMLODIPINE BESYLATE 10 MG PO TABS: 10.0000 mg | ORAL_TABLET | Freq: Every day | ORAL | 3 refills | Status: DC

## 2017-05-08 NOTE — Patient Instructions (Signed)
Medication Instructions:   INCREASE AMLODIPINE TO 10 MG ONCE DAILY= 2 OF THE 5 MG TABLETS ONCE DAILY  Follow-Up:  Your physician recommends that you schedule a follow-up appointment in: [place]  If you need a refill on your cardiac medications before your next appointment, please call your pharmacy.

## 2017-06-10 ENCOUNTER — Encounter: Payer: Self-pay | Admitting: Surgery

## 2017-06-10 ENCOUNTER — Institutional Professional Consult (permissible substitution) (INDEPENDENT_AMBULATORY_CARE_PROVIDER_SITE_OTHER): Payer: Medicare Other | Admitting: Surgery

## 2017-06-10 VITALS — BP 160/72 | HR 76 | Resp 20 | Ht 64.0 in | Wt 200.0 lb

## 2017-06-10 DIAGNOSIS — I712 Thoracic aortic aneurysm, without rupture, unspecified: Secondary | ICD-10-CM

## 2017-06-10 NOTE — Progress Notes (Signed)
Cardiothoracic Surgery Consultation  PCP is Cyndi Bender, PA-C Referring Provider is Lelon Perla, MD  Chief Complaint  Patient presents with  . Thoracic Aortic Aneurysm    Surgical eval, CTA Chest 05/05/17, ECHO 02/12/17    HPI:  The patient is an 84 year old woman with a history of hypertension and hyperlipidemia who presented with an episode of chest pain in 02/2015 and had a stress echocardiogram in 03/2015 which was considered a normal study.  She had another episode of chest tightness and palpitation in December 2018 similar to what she had had a year before.  Electrocardiogram was unchanged.  She had a follow-up echocardiogram on 02/12/2017 which showed normal left ventricular systolic function with ejection fraction of 60 to 65%.  The aortic valve is trileaflet with mild thickening and calcification and mild regurgitation.  The ascending aorta was moderately dilated.  She subsequently had a CTA of the chest on 05/05/2017 which showed a 5.1 cm fusiform ascending aortic aneurysm as well as a solitary 8 x 6 mm right upper lobe pulmonary nodule.  She has had no further episodes of chest discomfort.   Past Medical History:  Diagnosis Date  . Calculus of kidney   . Chronic kidney disease    stage 3  . Edema   . Glaucoma   . Hyperlipidemia   . Hypertension   . Lumbar spondylosis   . Osteopenia   . Palpitations   . Postural kyphosis of lumbar region   . Pre-diabetes   . Rosacea   . Vitamin D deficiency     Past Surgical History:  Procedure Laterality Date  . ABDOMINAL HYSTERECTOMY    . COLON SURGERY  2008   mass removed sigmoid    Family History  Problem Relation Age of Onset  . CVA Mother   . Cancer Father   . Cancer Sister   . Heart attack Brother   . Colon cancer Brother   . Breast cancer Sister   No family history of known connective tissue disorder, aortic aneurysms, or aortic dissection.  Social History Social History   Tobacco Use  . Smoking status:  Never Smoker  . Smokeless tobacco: Never Used  Substance Use Topics  . Alcohol use: No    Alcohol/week: 0.0 oz  . Drug use: Not on file    Current Outpatient Medications  Medication Sig Dispense Refill  . amLODipine (NORVASC) 10 MG tablet Take 1 tablet (10 mg total) by mouth daily. 90 tablet 3  . aspirin 81 MG tablet Take 81 mg by mouth daily.    [name] atorvastatin (LIPITOR) 10 MG tablet Take 10 mg by mouth daily.    . Biotin 1000 MCG tablet Take 1,000 mcg by mouth daily.    . calcium citrate-vitamin D (CITRACAL+D) 315-200 MG-UNIT tablet Take 1 tablet by mouth 2 (two) times daily.    . Cholecalciferol (VITAMIN D-3) 1000 units CAPS Take 1 capsule by mouth daily.    . furosemide (LASIX) 40 MG tablet Take 40 mg by mouth daily as needed.     [name] losartan (COZAAR) 100 MG tablet Take 100 mg by mouth daily.    . metoprolol succinate (TOPROL-XL) 100 MG 24 hr tablet Take 100 mg by mouth daily. Take with or immediately following a meal.    . Multiple Vitamins-Minerals (MULTIVITAMIN WITH MINERALS) tablet Take 1 tablet by mouth daily.     No current facility-administered medications for this visit.     No Known Allergies  Review of  Systems  Constitutional: Positive for fatigue.  HENT: Negative.   Eyes: Negative.   Respiratory: Negative for shortness of breath.   Cardiovascular: Positive for leg swelling.  Gastrointestinal: Negative.   Endocrine: Negative.   Genitourinary: Negative.   Musculoskeletal: Negative.   Skin: Negative.   Allergic/Immunologic: Negative.   Neurological: Negative.   Hematological: Bruises/bleeds easily.  Psychiatric/Behavioral: Negative.     BP (!) 160/72   Pulse 76   Resp 20   Ht 5\' 4"  (1.626 m)   Wt 200 lb (90.7 kg)   SpO2 97% Comment: RA  BMI 34.33 kg/m  Physical Exam  Constitutional: She is oriented to person, place, and time. She appears well-developed and well-nourished. No distress.  HENT:  Head: Normocephalic and atraumatic.  Mouth/Throat:  Oropharynx is clear and moist.  Eyes: Pupils are equal, round, and reactive to light. Conjunctivae and EOM are normal.  Neck: Normal range of motion. Neck supple. No thyromegaly present.  Cardiovascular: Normal rate, regular rhythm, normal heart sounds and intact distal pulses.  No murmur heard. Pulmonary/Chest: Effort normal and breath sounds normal. No respiratory distress.  Abdominal: Soft. Bowel sounds are normal. She exhibits no distension. There is no tenderness.  Musculoskeletal: Normal range of motion. She exhibits no edema.  Lymphadenopathy:    She has no cervical adenopathy.  Neurological: She is alert and oriented to person, place, and time.  Skin: Skin is warm and dry.  Psychiatric: She has a normal mood and affect.     Diagnostic Tests:  CLINICAL DATA:  Ascending aortic enlargement seen on recent cardiac echo exam. No chest pain.  EXAM: CT ANGIOGRAPHY CHEST WITH CONTRAST  TECHNIQUE: Multidetector CT imaging of the chest was performed using the standard protocol during bolus administration of intravenous contrast. Multiplanar CT image reconstructions and MIPs were obtained to evaluate the vascular anatomy.  CONTRAST:  118mL ISOVUE-370 IOPAMIDOL (ISOVUE-370) INJECTION 76%  COMPARISON:  None.  FINDINGS: Cardiovascular: The heart is within normal limits in size for age. No pericardial effusion. There is fusiform aneurysmal dilatation of the ascending aorta with maximum diameter of 5.1 cm at the level of the right main pulmonary artery. No dissection. Normal caliber of the descending thoracic aorta. Minimal scattered calcifications at the aortic arch. The branch vessels are patent. Scattered coronary artery calcifications are noted.  The pulmonary arteries appear normal.  Mediastinum/Nodes: No mediastinal or hilar mass or adenopathy. The esophagus is grossly normal.  Lungs/Pleura: Solitary 8 x 6 mm right upper lobe pulmonary nodule. No other pulmonary  lesions are identified. No acute pulmonary findings. No pleural effusion.  Upper Abdomen: Patchy peripheral area of enhancement in segment 2 of the liver is most likely a vascular shunt. No other significant upper abdominal findings. Atherosclerotic calcifications involving the upper abdominal aorta.  Musculoskeletal: No breast masses, supraclavicular or axillary adenopathy. The thyroid gland is grossly normal. No significant bony findings. Moderate degenerative changes involving the thoracic spine along with a scoliosis.  Review of the MIP images confirms the above findings.  IMPRESSION: 1. Fusiform aneurysmal dilatation of the ascending aorta with maximum diameter of 5.1 cm. No dissection. Ascending thoracic aortic aneurysm. Recommend semi-annual imaging followup by CTA or MRA and referral to cardiothoracic surgery if not already obtained. This recommendation follows 2010 ACCF/AHA/AATS/ACR/ASA/SCA/SCAI/SIR/STS/SVM Guidelines for the Diagnosis and Management of Patients With Thoracic Aortic Disease. Circulation. 2010; 121: D664-Q034 2. Solitary 8 x 6 mm right upper lobe pulmonary nodule is indeterminate. Smoothly marginated and slightly lobulated without definite spiculation. Non-contrast chest CT at 6-12  months is recommended. If the nodule is stable at time of repeat CT, then future CT at 18-24 months (from today's scan) is considered optional for low-risk patients, but is recommended for high-risk patients. This recommendation follows the consensus statement: Guidelines for Management of Incidental Pulmonary Nodules Detected on CT Images: From the Fleischner Society 2017; Radiology 2017; 284:228-243. 3. Scattered aortic and coronary artery calcifications.  Aortic Atherosclerosis (ICD10-I70.0).   Electronically Signed   By: Marijo Sanes M.D.   On: 05/06/2017 08:53   *[facility]*                        [address] [place], [place] [address]                            [phone]  ------------------------------------------------------------------- Echocardiography  Patient:    Elliott, Quade MR #:       [id number] Study Date: 02/12/2017 Gender:     F Age:        54 Height:     165.1 cm Weight:     89.4 kg BSA:        2.06 m^2 Pt. Status: Room:   SONOGRAPHER  Walker, Outpatient  ATTENDING    Almyra Deforest [id number]  [id number]     CWCB, [place] [id number]  Memory Dance, Isaac Laud [id number]  cc:  ------------------------------------------------------------------- LV EF: 60% -   65%  ------------------------------------------------------------------- Indications:      R07.9 Chest pain.  ------------------------------------------------------------------- History:   PMH:  Palpitations. Postural kyphosis of lumbar region. Risk factors:  Hypertension. Dyslipidemia.  ------------------------------------------------------------------- Study Conclusions  - Left ventricle: The cavity size was normal. There was moderate   concentric hypertrophy. Systolic function was normal. The   estimated ejection fraction was in the range of 60% to 65%. Wall   motion was normal; there were no regional wall motion   abnormalities. Doppler parameters are consistent with abnormal   left ventricular relaxation (grade 1 diastolic dysfunction).   Doppler parameters are consistent with elevated ventricular   end-diastolic filling pressure. - Aortic valve: Trileaflet; mildly thickened, mildly calcified   leaflets. There was mild regurgitation. - Ascending aorta: The ascending aorta was moderately dilated. - Mitral valve: There was mild regurgitation. - Right ventricle: Systolic function was normal. - Right atrium: The atrium was normal in size. - Tricuspid valve: There was mild regurgitation. - Pulmonary arteries: Systolic pressure was within the normal   range. - Inferior vena cava: The  vessel was normal in size. - Pericardium, extracardiac: There was no pericardial effusion.  Impressions:  - Ascending aortic aneurysm with moderate dilatation and maximum   measured diameter 48 mm. No prior echo is available for   comparison. A dedicated CTA or MRA is recommended for further   evaluation.  ------------------------------------------------------------------- Study data:  Comparison was made to the study of 03/29/2015.  Study status:  Routine.  Procedure:  The patient reported no pain pre or post test. Transthoracic echocardiography. Image quality was adequate.  Study completion:  There were no complications. Echocardiography.  M-mode, complete 2D, spectral Doppler, and color Doppler.  Birthdate:  Patient birthdate: [birth year].  Age:  Patient is 84 yr old.  Sex:  Gender: female.    BMI: 32.8  kg/m^2.  Blood pressure:     146/79  Patient status:  Outpatient.  Study date: Study date: 02/12/2017. Study time: 03:51 PM.  Location:  [facility]  -------------------------------------------------------------------  ------------------------------------------------------------------- Left ventricle:  The cavity size was normal. There was moderate concentric hypertrophy. Systolic function was normal. The estimated ejection fraction was in the range of 60% to 65%. Wall motion was normal; there were no regional wall motion abnormalities. Doppler parameters are consistent with abnormal left ventricular relaxation (grade 1 diastolic dysfunction). Doppler parameters are consistent with elevated ventricular end-diastolic filling pressure.  ------------------------------------------------------------------- Aortic valve:   Trileaflet; mildly thickened, mildly calcified leaflets. Mobility was not restricted.  Doppler:  Transvalvular velocity was within the normal range. There was no stenosis. There was mild  regurgitation.  ------------------------------------------------------------------- Aorta:  Aortic root: The aortic root was normal in size. Ascending aorta: The ascending aorta was moderately dilated.  ------------------------------------------------------------------- Mitral valve:   Structurally normal valve.   Mobility was not restricted.  Doppler:  Transvalvular velocity was within the normal range. There was no evidence for stenosis. There was mild regurgitation.    Peak gradient (D): 3 mm Hg.  ------------------------------------------------------------------- Left atrium:  The atrium was normal in size.  ------------------------------------------------------------------- Right ventricle:  The cavity size was normal. Wall thickness was normal. Systolic function was normal.  ------------------------------------------------------------------- Pulmonic valve:    Structurally normal valve.   Cusp separation was normal.  Doppler:  Transvalvular velocity was within the normal range. There was no evidence for stenosis. There was no regurgitation.  ------------------------------------------------------------------- Tricuspid valve:   Structurally normal valve.    Doppler: Transvalvular velocity was within the normal range. There was mild regurgitation.  ------------------------------------------------------------------- Pulmonary artery:   The main pulmonary artery was normal-sized. Systolic pressure was within the normal range.  ------------------------------------------------------------------- Right atrium:  The atrium was normal in size.  ------------------------------------------------------------------- Pericardium:  There was no pericardial effusion.  ------------------------------------------------------------------- Systemic veins: Inferior vena cava: The vessel was normal in  size.  ------------------------------------------------------------------- Measurements   Left ventricle                           Value        Reference  LV ID, ED, PLAX chordal          (L)     42.1  mm     43 - 52  LV ID, ES, PLAX chordal                  27.4  mm     23 - 38  LV fx shortening, PLAX chordal           35    %      >=29  LV PW thickness, ED                      15.2  mm     ---------  IVS/LV PW ratio, ED                      1.01         <=1.3  LV e&', lateral                           7.13  cm/s   ---------  LV E/e&', lateral  12.95        ---------  LV e&', medial                            4.17  cm/s   ---------  LV E/e&', medial                          22.13        ---------  LV e&', average                           5.65  cm/s   ---------  LV E/e&', average                         16.34        ---------    Ventricular septum                       Value        Reference  IVS thickness, ED                        15.3  mm     ---------    LVOT                                     Value        Reference  LVOT ID, S                               19    mm     ---------  LVOT area                                2.84  cm^2   ---------    Aortic valve                             Value        Reference  Aortic regurg peak velocity              265   cm/s   ---------  Aortic regurg pressure half-time         455   ms     ---------  Aortic regurg peak gradient              28    mm Hg  ---------    Aorta                                    Value        Reference  Aortic root ID, ED                       34    mm     ---------    Left atrium                              Value  Reference  LA ID, A-P, ES                           35    mm     ---------  LA ID/bsa, A-P                           1.7   cm/m^2 <=2.2  LA volume, S                             57    ml     ---------  LA volume/bsa, S                         27.7  ml/m^2 ---------   LA volume, ES, 1-p A4C                   50    ml     ---------  LA volume/bsa, ES, 1-p A4C               24.3  ml/m^2 ---------  LA volume, ES, 1-p A2C                   63    ml     ---------  LA volume/bsa, ES, 1-p A2C               30.6  ml/m^2 ---------    Mitral valve                             Value        Reference  Mitral E-wave peak velocity              92.3  cm/s   ---------  Mitral A-wave peak velocity              93.3  cm/s   ---------  Mitral deceleration time                 165   ms     150 - 230  Mitral peak gradient, D                  3     mm Hg  ---------  Mitral E/A ratio, peak                   1            ---------    Pulmonary arteries                       Value        Reference  PA pressure, S, DP                       23    mm Hg  <=30    Tricuspid valve                          Value        Reference  Tricuspid regurg peak velocity           221   cm/s   ---------  Tricuspid peak RV-RA gradient  20    mm Hg  ---------    Systemic veins                           Value        Reference  Estimated CVP                            3     mm Hg  ---------    Right ventricle                          Value        Reference  RV pressure, S, DP                       23    mm Hg  <=30  RV s&', lateral, S                        13.3  cm/s   ---------  Legend: (L)  and  (H)  mark values outside specified reference range.  ------------------------------------------------------------------- Prepared and Electronically Authenticated by  Ena Dawley, M.D. 2019-01-04T08:12:44   Impression:  This 84 year old woman has a 5.1 cm fusiform ascending aortic aneurysm.  There is no sign of aortic dissection.  Her blood pressure is somewhat elevated today but she said she usually checks her blood pressure frequently at home and it is almost always less than 485'I systolic.  I stressed the importance of good blood pressure control with her.  I reviewed the  CTA images with her and her daughter-in-law and answered their questions.  I told her that she could continue to be active but should avoid doing heavy lifting of more than 35 pounds.  There is no contraindication to aerobic activity.  I doubt that this aneurysm is the cause of her prior chest discomfort.  She will require continued close follow-up of this and I have recommended doing a repeat CTA of the chest in 6 months.  She also has a 6 mm right upper lobe pulmonary nodule which will require follow-up and that can be done with the same CT scan in 6 months.   Plan:  I will see her back in 6 months with a CTA of the chest to follow-up on her ascending aortic aneurysm and right upper lobe pulmonary nodule.   I spent 30 minutes performing this consultation and > 50% of this time was spent face to face counseling and coordinating the care of this patient's ascending aortic aneurysm and right upper lobe pulmonary nodule.   Gaye Pollack, MD [facility] [phone]

## 2017-07-02 DIAGNOSIS — E7800 Pure hypercholesterolemia, unspecified: Secondary | ICD-10-CM | POA: Diagnosis not present

## 2017-07-02 DIAGNOSIS — G250 Essential tremor: Secondary | ICD-10-CM | POA: Diagnosis not present

## 2017-07-02 DIAGNOSIS — E559 Vitamin D deficiency, unspecified: Secondary | ICD-10-CM | POA: Diagnosis not present

## 2017-07-02 DIAGNOSIS — N183 Chronic kidney disease, stage 3 (moderate): Secondary | ICD-10-CM | POA: Diagnosis not present

## 2017-07-02 DIAGNOSIS — R609 Edema, unspecified: Secondary | ICD-10-CM | POA: Diagnosis not present

## 2017-07-02 DIAGNOSIS — I10 Essential (primary) hypertension: Secondary | ICD-10-CM | POA: Diagnosis not present

## 2017-10-19 ENCOUNTER — Encounter: Payer: Self-pay | Admitting: Cardiology

## 2017-10-26 NOTE — Progress Notes (Signed)
HPI: FU TAA. Stress echocardiogram 2/17 normal. Seen for CP 12/18. Echo 1/19 showed normal LV function, grade 1 DD, mild AI, moderately dilated aortic root, mild MR and TR. CTA 3/19 showed fusiform aneurysmal dilatation of ascending aorta measuring 5.1 cm.  There was a right upper lobe pulmonary nodule and follow-up recommended 6-12 months. Seen by Dr Cyndia Bent and FU CTs recommended. Since last seen,  there is no dyspnea, chest pain, palpitations or syncope.  Minimal pedal edema.  Current Outpatient Medications  Medication Sig Dispense Refill  . amLODipine (NORVASC) 10 MG tablet Take 1 tablet (10 mg total) by mouth daily. 90 tablet 3  . aspirin 81 MG tablet Take 81 mg by mouth daily.    [name] atorvastatin (LIPITOR) 10 MG tablet Take 10 mg by mouth daily.    . Biotin 1000 MCG tablet Take 1,000 mcg by mouth daily.    . calcium citrate-vitamin D (CITRACAL+D) 315-200 MG-UNIT tablet Take 1 tablet by mouth 2 (two) times daily.    . Cholecalciferol (VITAMIN D-3) 1000 units CAPS Take 1 capsule by mouth daily.    . furosemide (LASIX) 40 MG tablet Take 40 mg by mouth daily as needed.     [name] losartan (COZAAR) 100 MG tablet Take 100 mg by mouth daily.    . metoprolol succinate (TOPROL-XL) 100 MG 24 hr tablet Take 100 mg by mouth daily. Take with or immediately following a meal.    . Multiple Vitamins-Minerals (MULTIVITAMIN WITH MINERALS) tablet Take 1 tablet by mouth daily.     No current facility-administered medications for this visit.      Past Medical History:  Diagnosis Date  . Calculus of kidney   . Chronic kidney disease    stage 3  . Edema   . Glaucoma   . Hyperlipidemia   . Hypertension   . Lumbar spondylosis   . Osteopenia   . Palpitations   . Postural kyphosis of lumbar region   . Pre-diabetes   . Rosacea   . Vitamin D deficiency     Past Surgical History:  Procedure Laterality Date  . ABDOMINAL HYSTERECTOMY    . COLON SURGERY  2008   mass removed sigmoid    Social  History   Socioeconomic History  . Marital status: Married    Spouse name: Not on file  . Number of children: 2  . Years of education: Not on file  . Highest education level: Not on file  Occupational History  . Not on file  Social Needs  . Financial resource strain: Not on file  . Food insecurity:    Worry: Not on file    Inability: Not on file  . Transportation needs:    Medical: Not on file    Non-medical: Not on file  Tobacco Use  . Smoking status: Never Smoker  . Smokeless tobacco: Never Used  Substance and Sexual Activity  . Alcohol use: No    Alcohol/week: 0.0 standard drinks  . Drug use: Never  . Sexual activity: Not on file  Lifestyle  . Physical activity:    Days per week: Not on file    Minutes per session: Not on file  . Stress: Not on file  Relationships  . Social connections:    Talks on phone: Not on file    Gets together: Not on file    Attends religious service: Not on file    Active member of club or organization: Not on file  Attends meetings of clubs or organizations: Not on file    Relationship status: Not on file  . Intimate partner violence:    Fear of current or ex partner: Not on file    Emotionally abused: Not on file    Physically abused: Not on file    Forced sexual activity: Not on file  Other Topics Concern  . Not on file  Social History Narrative  . Not on file    Family History  Problem Relation Age of Onset  . CVA Mother   . Cancer Father   . Cancer Sister   . Heart attack Brother   . Colon cancer Brother   . Breast cancer Sister     ROS: Arthralgias but no fevers or chills, productive cough, hemoptysis, dysphasia, odynophagia, melena, hematochezia, dysuria, hematuria, rash, seizure activity, orthopnea, PND, claudication. Remaining systems are negative.  Physical Exam: Well-developed well-nourished in no acute distress.  Skin is warm and dry.  HEENT is normal.  Neck is supple.  Chest is clear to auscultation with  normal expansion.  Cardiovascular exam is regular rate and rhythm.  Abdominal exam nontender or distended. No masses palpated. Extremities show trace edema. neuro grossly intact   A/P  1 thoracic aortic aneurysm-Will arrange follow-up CTA of thoracic aorta in November and then follow-up with Dr. Cyndia Bent afterwards.    2 pulmonary nodules-follow-up CT in November as outlined above.  3 hypertension-blood pressure is elevated.  I will add HCTZ 12.5 mg daily.  Check potassium and renal function in 1 week.  Follow blood pressure and adjust regimen as needed.  4 hyperlipidemia-continue statin.  5 previous history of chest pain-no recurrent symptoms.  No further evaluation.  Kirk Ruths, MD

## 2017-11-02 ENCOUNTER — Ambulatory Visit (INDEPENDENT_AMBULATORY_CARE_PROVIDER_SITE_OTHER): Payer: Medicare Other | Admitting: Cardiology

## 2017-11-02 ENCOUNTER — Encounter: Payer: Self-pay | Admitting: Cardiology

## 2017-11-02 VITALS — BP 152/68 | HR 70 | Ht 64.0 in | Wt 201.0 lb

## 2017-11-02 DIAGNOSIS — I10 Essential (primary) hypertension: Secondary | ICD-10-CM | POA: Diagnosis not present

## 2017-11-02 DIAGNOSIS — E7800 Pure hypercholesterolemia, unspecified: Secondary | ICD-10-CM

## 2017-11-02 DIAGNOSIS — I712 Thoracic aortic aneurysm, without rupture, unspecified: Secondary | ICD-10-CM

## 2017-11-02 MED ORDER — HYDROCHLOROTHIAZIDE 12.5 MG PO CAPS: 12.5000 mg | ORAL_CAPSULE | Freq: Every day | ORAL | 3 refills | Status: DC

## 2017-11-02 NOTE — Patient Instructions (Signed)
Medication Instructions:   START HCTZ 12.5 MG ONCE DAILY  Labwork:  Your physician recommends that you return for lab work in: [place]  Testing/Procedures:  CTA OF THE CHEST W/WO AT [place] UP THORACIC ANEURYSM AND LUNG NODULE-SCHEDULE EARLY NOVEMBER  Follow-Up:  Your physician wants you to follow-up in: [place] will receive a reminder letter in the mail two months in advance. If you don't receive a letter, please call our office to schedule the follow-up appointment.   PLEASE ARRANGE FOLLOW UP WITH DR Cyndia Bent AFTER CTA COMPLETE  If you need a refill on your cardiac medications before your next appointment, please call your pharmacy.

## 2017-11-09 ENCOUNTER — Telehealth: Payer: Self-pay | Admitting: Cardiology

## 2017-11-09 NOTE — Telephone Encounter (Signed)
Left message for patient of dr crenshaw's recommendations. 

## 2017-11-09 NOTE — Telephone Encounter (Signed)
Ok to stop hctz and follow bp Kirk Ruths

## 2017-11-09 NOTE — Telephone Encounter (Signed)
New message   Pt c/o medication issue:  1. Name of Medication: hydrochlorothiazide (MICROZIDE) 12.5 MG capsule  2. How are you currently taking this medication (dosage and times per day)? 1 time daily  3. Are you having a reaction (difficulty breathing--STAT)? No   4. What is your medication issue? Patient states that she is having dizziness, and the patient states that it is not pulling the fluid out of her body like it should.

## 2017-11-09 NOTE — Telephone Encounter (Signed)
Pt called to report that she has started the HCTZ 12.5mg  last Wed 9/25 per Dr. Jacalyn Lefevre orders. She woke up Thurs 9/26 with some dizziness.. Bp 120/50 HR 65... She decided to hold the HCTZ and her BP has been running the same since then so she has not taken any more HCTZ and has not had any more dizziness. Today her BP is 120/60 and HR 70 so she is still holding her HCTZ.Marland KitchenPt is not going to have her BMET drawn since she has not had anymore of the HCTZ.. She is going to keep track of her BP twice a day and call us in a few days with the readings. I also advised her that I will let Dr. Stanford Breed know what is going on and let her know if he has any other recommendations. Pt verbalized understanding and agrees to keep track of her BP and call us if they tend to be elevated again.

## 2017-11-10 NOTE — Telephone Encounter (Signed)
Pt aware to monitor b/p and if notes trending upward to call office the patient agrees ..Bridget Evans

## 2017-11-13 ENCOUNTER — Other Ambulatory Visit: Payer: Self-pay | Admitting: *Deleted

## 2017-12-07 DIAGNOSIS — I712 Thoracic aortic aneurysm, without rupture: Secondary | ICD-10-CM | POA: Diagnosis not present

## 2017-12-08 ENCOUNTER — Encounter: Payer: Self-pay | Admitting: *Deleted

## 2017-12-08 LAB — BASIC METABOLIC PANEL
BUN / CREAT RATIO: 22 (ref 12–28)
BUN: 17 mg/dL (ref 8–27)
CO2: 26 mmol/L (ref 20–29)
CREATININE: 0.77 mg/dL (ref 0.57–1.00)
Calcium: 10 mg/dL (ref 8.7–10.3)
Chloride: 103 mmol/L (ref 96–106)
GFR calc non Af Amer: 71 mL/min/{1.73_m2} (ref >59)
GFR, EST AFRICAN AMERICAN: 82 mL/min/{1.73_m2} (ref >59)
GLUCOSE: 93 mg/dL (ref 65–99)
Potassium: 4.3 mmol/L (ref 3.5–5.2)
Sodium: 139 mmol/L (ref 134–144)

## 2017-12-15 ENCOUNTER — Ambulatory Visit (INDEPENDENT_AMBULATORY_CARE_PROVIDER_SITE_OTHER)
Admission: RE | Admit: 2017-12-15 | Discharge: 2017-12-15 | Disposition: A | Discharge status: Home / Self Care | Payer: Medicare Other | Source: Ambulatory Visit | Attending: Cardiology | Admitting: Cardiology

## 2017-12-15 DIAGNOSIS — I712 Thoracic aortic aneurysm, without rupture, unspecified: Secondary | ICD-10-CM

## 2017-12-15 MED ORDER — IOPAMIDOL (ISOVUE-370) INJECTION 76%
100.0000 mL | Freq: Once | INTRAVENOUS | Status: AC | PRN
  Administered 2017-12-15: 100 mL via INTRAVENOUS

## 2017-12-16 ENCOUNTER — Ambulatory Visit (INDEPENDENT_AMBULATORY_CARE_PROVIDER_SITE_OTHER): Payer: Medicare Other | Admitting: Surgery

## 2017-12-16 VITALS — BP 159/72 | HR 75 | Resp 20 | Ht 64.0 in | Wt 200.0 lb

## 2017-12-16 DIAGNOSIS — I712 Thoracic aortic aneurysm, without rupture, unspecified: Secondary | ICD-10-CM

## 2017-12-16 DIAGNOSIS — R911 Solitary pulmonary nodule: Secondary | ICD-10-CM | POA: Diagnosis not present

## 2017-12-17 ENCOUNTER — Encounter: Payer: Self-pay | Admitting: Surgery

## 2017-12-17 NOTE — Progress Notes (Signed)
HPI:  The patient returns for follow-up of a 5.1 cm fusiform ascending aortic aneurysm as well as a solitary 8 x 6 mm right upper lobe pulmonary nodule seen on CTA of the chest on 05/05/2017.  She had been found to have a dilated ascending aorta on echocardiogram done on 02/12/2017.  This showed a trileaflet aortic valve with mildly thickened and calcified leaflets with no restriction of mobility.  There is no evidence of stenosis and mild regurgitation.  Since I last saw her she has been feeling well and denies any chest pain or shortness of breath.  Current Outpatient Medications  Medication Sig Dispense Refill  . amLODipine (NORVASC) 10 MG tablet Take 1 tablet (10 mg total) by mouth daily. 90 tablet 3  . aspirin 81 MG tablet Take 81 mg by mouth daily.    [name] atorvastatin (LIPITOR) 10 MG tablet Take 10 mg by mouth daily.    . Biotin 1000 MCG tablet Take 1,000 mcg by mouth daily.    . calcium citrate-vitamin D (CITRACAL+D) 315-200 MG-UNIT tablet Take 1 tablet by mouth 2 (two) times daily.    . Cholecalciferol (VITAMIN D-3) 1000 units CAPS Take 1 capsule by mouth daily.    . furosemide (LASIX) 40 MG tablet Take 40 mg by mouth daily as needed.     [name] losartan (COZAAR) 100 MG tablet Take 100 mg by mouth daily.    . metoprolol succinate (TOPROL-XL) 100 MG 24 hr tablet Take 100 mg by mouth daily. Take with or immediately following a meal.    . Multiple Vitamins-Minerals (MULTIVITAMIN WITH MINERALS) tablet Take 1 tablet by mouth daily.     No current facility-administered medications for this visit.      Physical Exam: BP (!) 159/72   Pulse 75   Resp 20   Ht 5\' 4"  (1.626 m)   Wt 200 lb (90.7 kg)   SpO2 96% Comment: RA  BMI 34.33 kg/m  She looks well. Cardiac exam shows regular rate and rhythm with normal heart sounds.  There is no murmur. Lungs are clear. There is no peripheral edema.  Diagnostic Tests:  CLINICAL DATA:  Follow-up of aneurysmal disease of the ascending thoracic  aorta.  EXAM: CT ANGIOGRAPHY CHEST WITH CONTRAST  TECHNIQUE: Multidetector CT imaging of the chest was performed using the standard protocol during bolus administration of intravenous contrast. Multiplanar CT image reconstructions and MIPs were obtained to evaluate the vascular anatomy.  CONTRAST:  158mL ISOVUE-370 IOPAMIDOL (ISOVUE-370) INJECTION 76%  COMPARISON:  05/05/2017  FINDINGS: Cardiovascular: There is stable aneurysmal disease of the ascending thoracic aorta measuring approximately 5.2 cm in greatest diameter. The aortic root is normal in caliber at the level of the sinuses of Valsalva measuring approximately 3.8 cm. The proximal arch measures 4.0 cm. The distal arch measures 2.9 cm. The descending thoracic aorta measures 2.9 cm. No evidence of aortic dissection. Proximal great vessels show normal patency. 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  The heart size is stable and within normal limits. No pericardial fluid identified. Calcified coronary artery plaque again noted in the distribution of the LAD and left circumflex coronary arteries. Central pulmonary arteries are normal in caliber.  Mediastinum/Nodes: No enlarged mediastinal, hilar, or axillary lymph nodes. Thyroid gland, trachea, and esophagus demonstrate no significant findings.  Lungs/Pleura: Stable anterior right upper lobe lung nodule measuring approximately 7 mm. Stable additional nodularity at the lateral left lung base with 2  separate areas measuring roughly 5 mm and 6 mm respectively. Stable scarring at the left lung base. There is no evidence of pulmonary edema, consolidation, pneumothorax or pleural fluid.  Upper Abdomen: Stable hypervascular subcapsular arterial perfusion abnormality in the anterior left lobe of the liver.  Musculoskeletal: No chest wall abnormality. No acute or significant osseous  findings.  Review of the MIP images confirms the above findings.  IMPRESSION: 1. Stable aneurysmal disease of the ascending thoracic aorta measuring approximately 5.2 cm in greatest diameter. 2. 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 3. Stable coronary atherosclerosis with calcified plaque in the distribution of the LAD and left circumflex coronary arteries. 4. Stable 7 mm right upper lobe lung nodule. 5. Stable hypervascular subcapsular perfusion abnormality/shunt in the anterior left lobe of the liver. This appears likely benign.  Aortic aneurysm NOS (ICD10-I71.9).   Electronically Signed   By: Aletta Edouard M.D.   On: 12/15/2017 16:21  Impression:  I have personally reviewed her CTA of the chest.  She has a stable 5.2 cm fusiform ascending aortic aneurysm.  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  The ascending aortic aneurysm is still below the 5.5 cm surgical threshold and I would be very conservative in this 84 year old patient.  I stressed the importance of good blood pressure control.  Her blood pressure is moderately elevated today although she says when she checks it at home it is usually under control.  She feels like she has "white coat syndrome ".  She is on 3 antihypertensives including a beta-blocker as well as Lasix.  The right upper lobe nodule is stable at 7 mm.  I reviewed the CT images with her and answered all of her questions.  I recommended that she have a follow-up study in 6 months.  Plan:  She will return to see me in 6 months with a CTA of the chest.  I spent 15 minutes performing this established patient evaluation and > 50% of this time was spent face to face counseling and coordinating the care of this patient's aortic aneurysm and right upper lobe lung nodule.    Gaye Pollack, MD [facility] [phone]

## 2018-01-05 DIAGNOSIS — Z1339 Encounter for screening examination for other mental health and behavioral disorders: Secondary | ICD-10-CM | POA: Diagnosis not present

## 2018-01-05 DIAGNOSIS — Z9181 History of falling: Secondary | ICD-10-CM | POA: Diagnosis not present

## 2018-01-05 DIAGNOSIS — E7800 Pure hypercholesterolemia, unspecified: Secondary | ICD-10-CM | POA: Diagnosis not present

## 2018-01-05 DIAGNOSIS — I712 Thoracic aortic aneurysm, without rupture: Secondary | ICD-10-CM | POA: Diagnosis not present

## 2018-01-05 DIAGNOSIS — R609 Edema, unspecified: Secondary | ICD-10-CM | POA: Diagnosis not present

## 2018-01-05 DIAGNOSIS — I10 Essential (primary) hypertension: Secondary | ICD-10-CM | POA: Diagnosis not present

## 2018-01-05 DIAGNOSIS — N183 Chronic kidney disease, stage 3 (moderate): Secondary | ICD-10-CM | POA: Diagnosis not present

## 2018-01-05 DIAGNOSIS — Z1331 Encounter for screening for depression: Secondary | ICD-10-CM | POA: Diagnosis not present

## 2018-02-20 DIAGNOSIS — H52223 Regular astigmatism, bilateral: Secondary | ICD-10-CM | POA: Diagnosis not present

## 2018-02-20 DIAGNOSIS — H04123 Dry eye syndrome of bilateral lacrimal glands: Secondary | ICD-10-CM | POA: Diagnosis not present

## 2018-02-20 DIAGNOSIS — H5203 Hypermetropia, bilateral: Secondary | ICD-10-CM | POA: Diagnosis not present

## 2018-04-20 NOTE — Progress Notes (Signed)
HPI: FUTAA. Stress echocardiogram2/17normal. Seen for CP 12/18. Echo 1/19 showed normal LV function, grade 1 DD, mild AI, moderately dilated aortic root, mild MR and TR. CTA 3/19 showedfusiform aneurysmal dilatation of ascending aorta measuring 5.1 cm. There was a right upper lobe pulmonary nodule and follow-up recommended 6-12 months. Seen by Dr Cyndia Bent and FU CTs recommended.   Follow-up CTA November 2019 showed stable aneurysmal dilatation of the ascending aorta at 5.2 cm.  The innominate was dilated at 2 cm and the right upper lobe lung nodule was stable as well.  Since last seen,there is no dyspnea, chest pain, palpitations or syncope.  Current Outpatient Medications  Medication Sig Dispense Refill  . amLODipine (NORVASC) 10 MG tablet Take 1 tablet (10 mg total) by mouth daily. 90 tablet 3  . aspirin 81 MG tablet Take 81 mg by mouth daily.    [name] atorvastatin (LIPITOR) 10 MG tablet Take 10 mg by mouth daily.    . Biotin 1000 MCG tablet Take 1,000 mcg by mouth daily.    . calcium citrate-vitamin D (CITRACAL+D) 315-200 MG-UNIT tablet Take 1 tablet by mouth 2 (two) times daily.    . Cholecalciferol (VITAMIN D-3) 1000 units CAPS Take 1 capsule by mouth daily.    . furosemide (LASIX) 40 MG tablet Take 40 mg by mouth daily as needed.     [name] losartan (COZAAR) 100 MG tablet Take 100 mg by mouth daily.    . metoprolol succinate (TOPROL-XL) 100 MG 24 hr tablet Take 100 mg by mouth daily. Take with or immediately following a meal.    . Multiple Vitamins-Minerals (MULTIVITAMIN WITH MINERALS) tablet Take 1 tablet by mouth daily.     No current facility-administered medications for this visit.      Past Medical History:  Diagnosis Date  . Calculus of kidney   . Chronic kidney disease    stage 3  . Edema   . Glaucoma   . Hyperlipidemia   . Hypertension   . Lumbar spondylosis   . Osteopenia   . Palpitations   . Postural kyphosis of lumbar region   . Pre-diabetes   . Rosacea   .  Vitamin D deficiency     Past Surgical History:  Procedure Laterality Date  . ABDOMINAL HYSTERECTOMY    . COLON SURGERY  2008   mass removed sigmoid    Social History   Socioeconomic History  . Marital status: Married    Spouse name: Not on file  . Number of children: 2  . Years of education: Not on file  . Highest education level: Not on file  Occupational History  . Not on file  Social Needs  . Financial resource strain: Not on file  . Food insecurity:    Worry: Not on file    Inability: Not on file  . Transportation needs:    Medical: Not on file    Non-medical: Not on file  Tobacco Use  . Smoking status: Never Smoker  . Smokeless tobacco: Never Used  Substance and Sexual Activity  . Alcohol use: No    Alcohol/week: 0.0 standard drinks  . Drug use: Never  . Sexual activity: Not on file  Lifestyle  . Physical activity:    Days per week: Not on file    Minutes per session: Not on file  . Stress: Not on file  Relationships  . Social connections:    Talks on phone: Not on file    Gets together:  Not on file    Attends religious service: Not on file    Active member of club or organization: Not on file    Attends meetings of clubs or organizations: Not on file    Relationship status: Not on file  . Intimate partner violence:    Fear of current or ex partner: Not on file    Emotionally abused: Not on file    Physically abused: Not on file    Forced sexual activity: Not on file  Other Topics Concern  . Not on file  Social History Narrative  . Not on file    Family History  Problem Relation Age of Onset  . CVA Mother   . Cancer Father   . Cancer Sister   . Heart attack Brother   . Colon cancer Brother   . Breast cancer Sister     ROS: no fevers or chills, productive cough, hemoptysis, dysphasia, odynophagia, melena, hematochezia, dysuria, hematuria, rash, seizure activity, orthopnea, PND, claudication. Remaining systems are negative.  Physical  Exam: Well-developed well-nourished in no acute distress.  Skin is warm and dry.  HEENT is normal.  Neck is supple.  Chest is clear to auscultation with normal expansion.  Cardiovascular exam is regular rate and rhythm.  1/6 systolic murmur left sternal border. Abdominal exam nontender or distended. No masses palpated. Extremities show trace to 1+ edema. neuro grossly intact  ECG-normal sinus rhythm at a rate of 62, no ST changes.  Personally reviewed  A/P  1 thoracic aortic aneurysm-patient will need follow-up CTA May 2020.  Followed by Dr. Cyndia Bent.  2 pulmonary nodules-stable on most recent CT scan.  Follow-up study May 2020 as outlined.  3 hypertension-patient's blood pressure appears to be controlled.  However she has developed lower extremity edema on higher dose of amlodipine.  Decrease to 5 mg daily.  Add clonidine 0.1 mg daily.  Follow blood pressure and advance regimen as needed.  4 hyperlipidemia-continue statin.  Kirk Ruths, MD

## 2018-04-27 ENCOUNTER — Other Ambulatory Visit: Payer: Self-pay

## 2018-04-27 ENCOUNTER — Ambulatory Visit (INDEPENDENT_AMBULATORY_CARE_PROVIDER_SITE_OTHER): Payer: Medicare Other | Admitting: Cardiology

## 2018-04-27 ENCOUNTER — Encounter: Payer: Self-pay | Admitting: Cardiology

## 2018-04-27 VITALS — BP 140/66 | HR 62 | Ht 64.0 in | Wt 205.0 lb

## 2018-04-27 DIAGNOSIS — I10 Essential (primary) hypertension: Secondary | ICD-10-CM

## 2018-04-27 DIAGNOSIS — I712 Thoracic aortic aneurysm, without rupture, unspecified: Secondary | ICD-10-CM

## 2018-04-27 DIAGNOSIS — E7800 Pure hypercholesterolemia, unspecified: Secondary | ICD-10-CM | POA: Diagnosis not present

## 2018-04-27 MED ORDER — CLONIDINE HCL 0.1 MG PO TABS: 0.1000 mg | ORAL_TABLET | Freq: Every day | ORAL | 3 refills | Status: DC

## 2018-04-27 MED ORDER — AMLODIPINE BESYLATE 5 MG PO TABS: 5.0000 mg | ORAL_TABLET | Freq: Every day | ORAL | 3 refills | Status: DC

## 2018-04-27 MED ORDER — CLONIDINE HCL 0.1 MG PO TABS: 0.1000 mg | ORAL_TABLET | Freq: Every day | ORAL | 0 refills | Status: DC

## 2018-04-27 NOTE — Patient Instructions (Signed)
Medication Instructions:  DECREASE AMLODIPINE TO 5 MG ONCE DAILY=1/2 OF THE 10 MG TABLET ONCE DAILY  START CLONIDINE 0.1 MG ONE TABLET ONCE DAILY If you need a refill on your cardiac medications before your next appointment, please call your pharmacy.   Lab work: If you have labs (blood work) drawn today and your tests are completely normal, you will receive your results only by: [name] MyChart Message (if you have MyChart) OR . A paper copy in the mail If you have any lab test that is abnormal or we need to change your treatment, we will call you to review the results.  Follow-Up: At [facility], you and your health needs are our priority.  As part of our continuing mission to provide you with exceptional heart care, we have created designated Provider Care Teams.  These Care Teams include your primary Cardiologist (physician) and Advanced Practice Providers (APPs -  Physician Assistants and Nurse Practitioners) who all work together to provide you with the care you need, when you need it. You will need a follow up appointment in 6 months.  Please call our office 2 months in advance to schedule this appointment.  You may see Kirk Ruths MD or one of the following Advanced Practice Providers on your designated Care Team:   Kerin Ransom, PA-C Roby Lofts, [place] . Sande Rives, PA-C

## 2018-05-05 DIAGNOSIS — M79672 Pain in left foot: Secondary | ICD-10-CM | POA: Diagnosis not present

## 2018-05-05 DIAGNOSIS — Z6837 Body mass index (BMI) 37.0-37.9, adult: Secondary | ICD-10-CM | POA: Diagnosis not present

## 2018-05-12 ENCOUNTER — Other Ambulatory Visit: Payer: Self-pay | Admitting: *Deleted

## 2018-05-12 IMAGING — CT CT ANGIO CHEST
2 of 7 series · 17 of 46 positions shown · IV contrast (iopamidol)
Comparison: None.

CLINICAL DATA: Ascending aortic enlargement seen on recent cardiac
echo exam. No chest pain.

EXAM:
CT ANGIOGRAPHY CHEST WITH CONTRAST
TECHNIQUE: Multidetector CT imaging of the chest was performed using the
standard protocol during bolus administration of intravenous
contrast. Multiplanar CT image reconstructions and MIPs were
obtained to evaluate the vascular anatomy.
CONTRAST:  100mL [id number] IOPAMIDOL ([id number]) INJECTION 76%

[Series 4: aorta 3.0 i31f 2 · axial · 0.70mm/px · z∈[+1444,+1716]mm · 14 of 99 slices shown]
[im 4/99  lung]
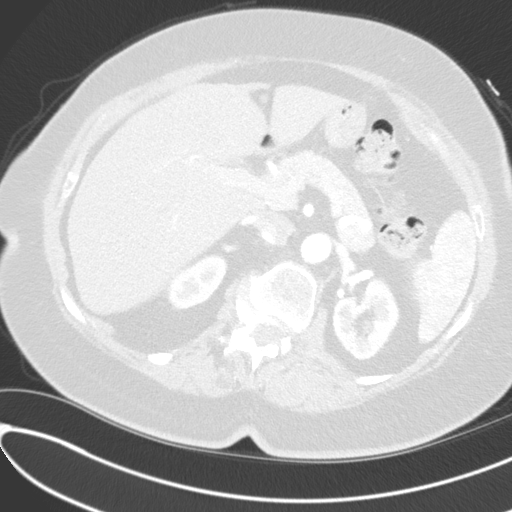
[im 11/99  soft-tissue]
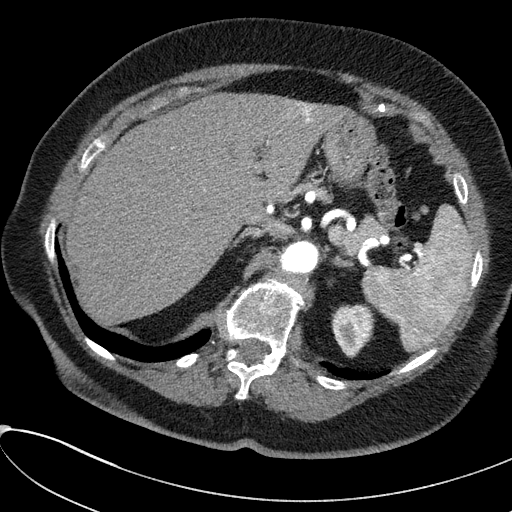
[im 18/99  lung]
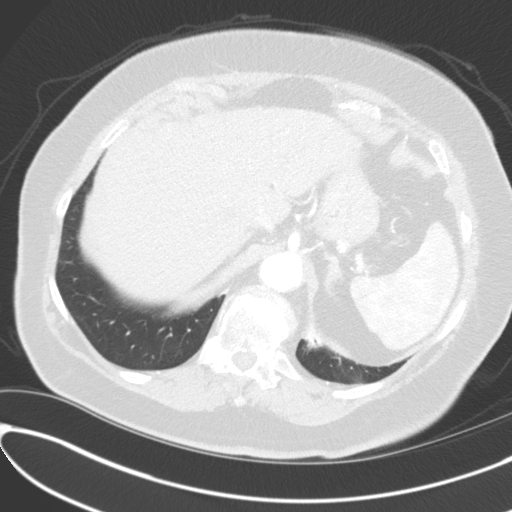
[im 25/99  soft-tissue]
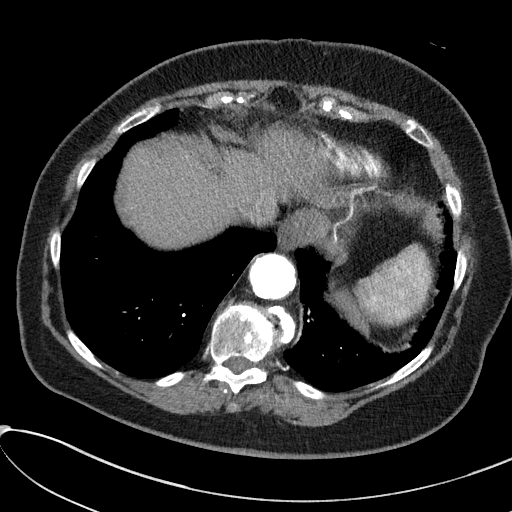
[im 32/99  lung]
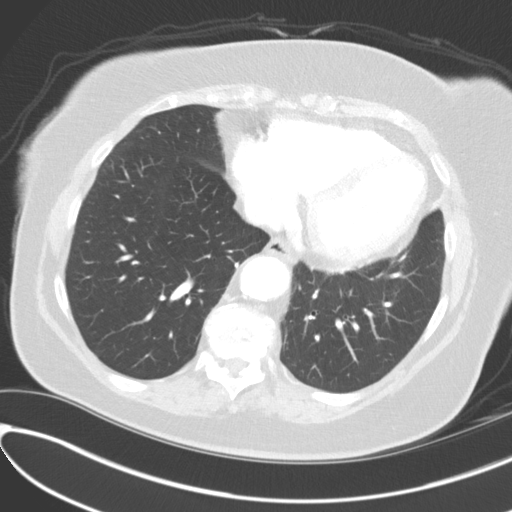
[im 39/99  soft-tissue]
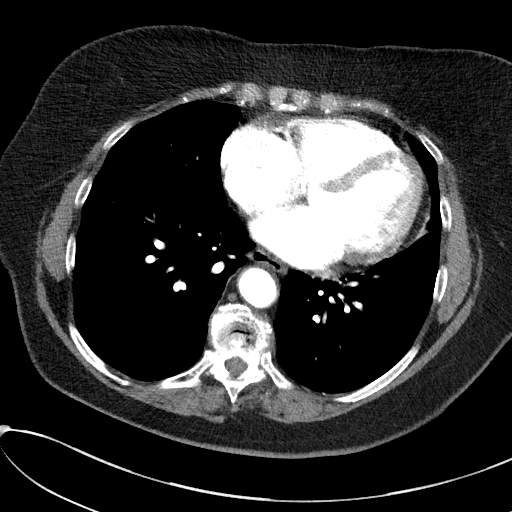
[im 46/99  lung]
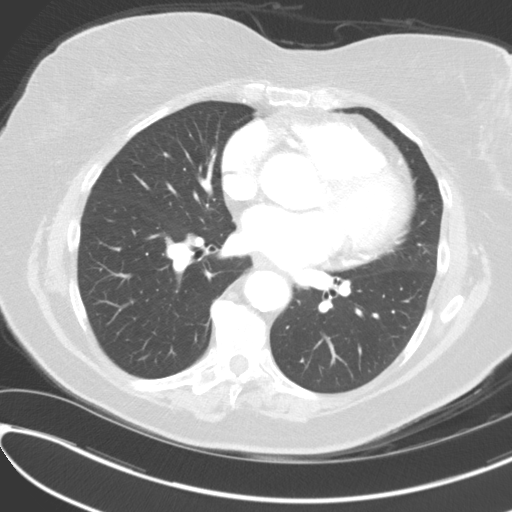
[im 53/99  soft-tissue]
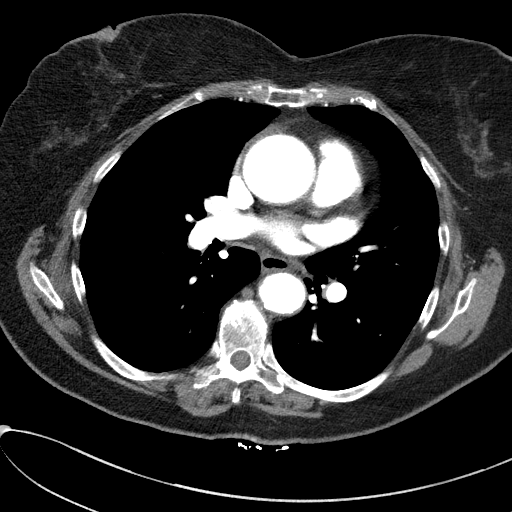
[im 60/99  lung]
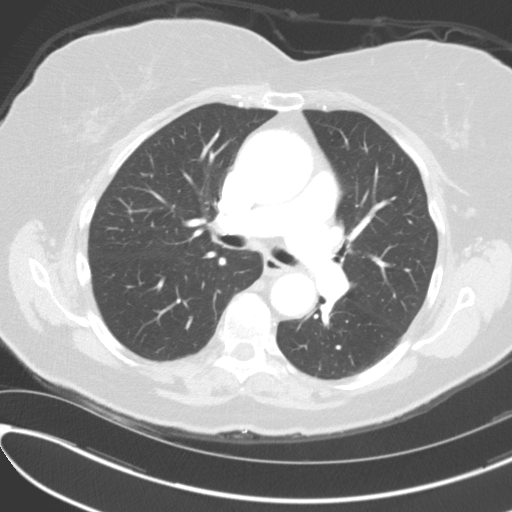
[im 67/99  soft-tissue]
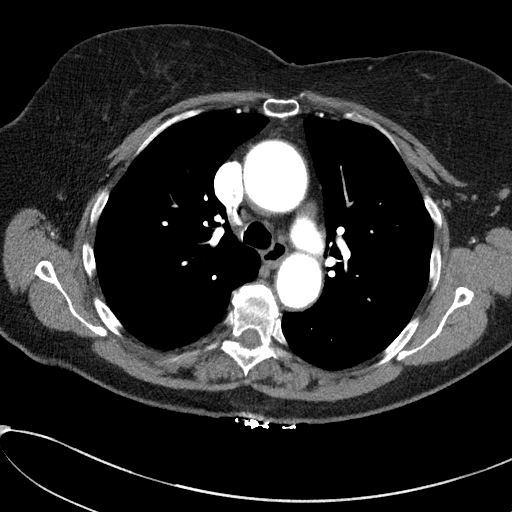
[im 74/99  lung]
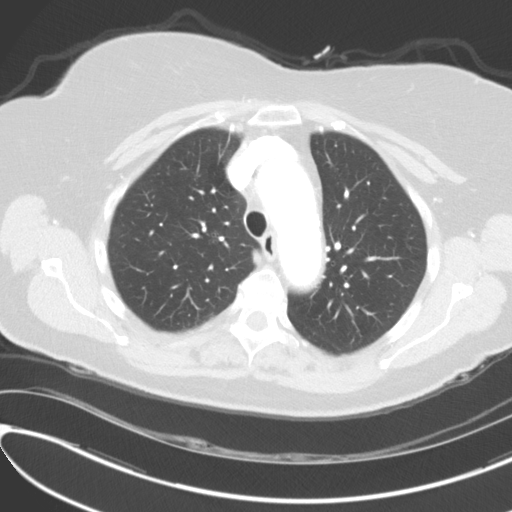
[im 81/99  soft-tissue]
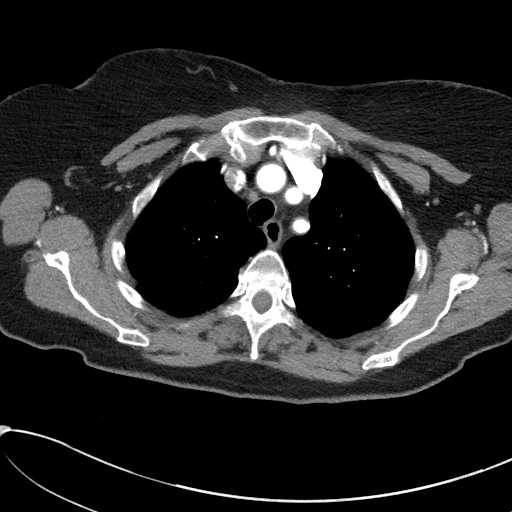
[im 88/99  lung]
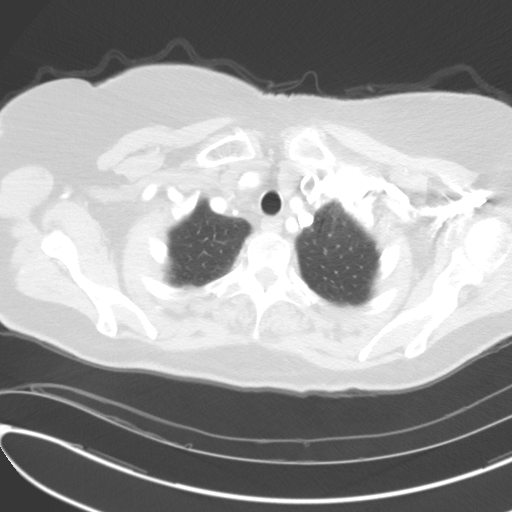
[im 95/99  soft-tissue]
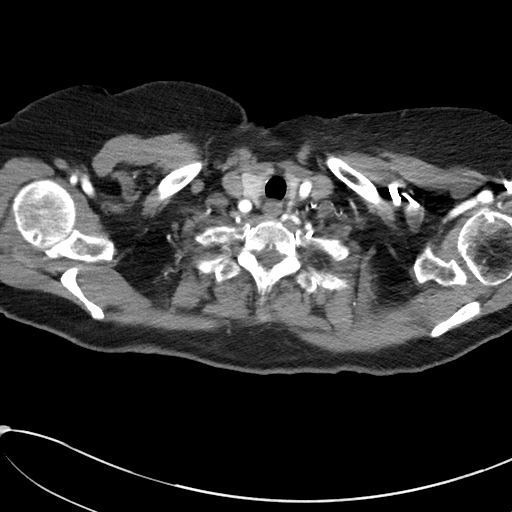

[Series 7: coronals · coronal · 0.62mm/px · 3 of 140 slices shown]
[im 35/140  soft-tissue]
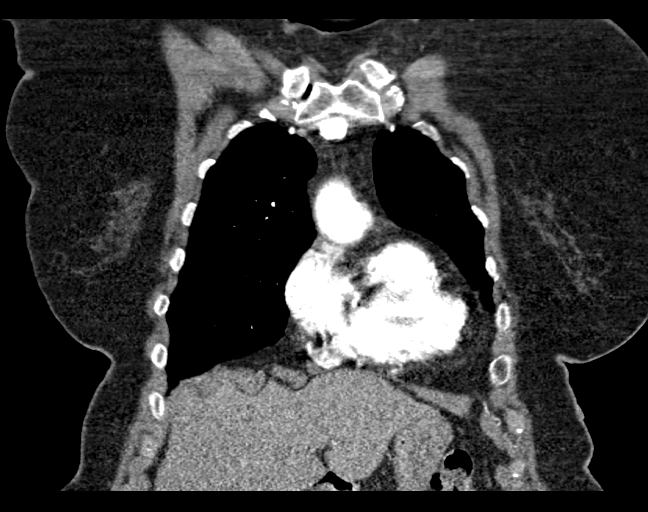
[im 70/140  soft-tissue]
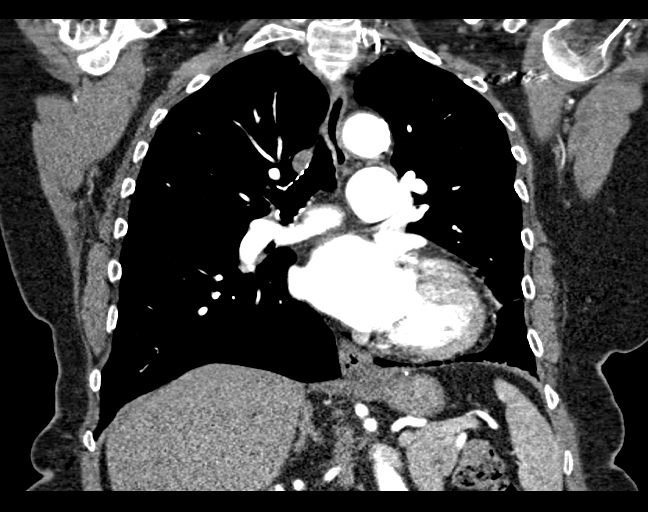
[im 105/140  soft-tissue]
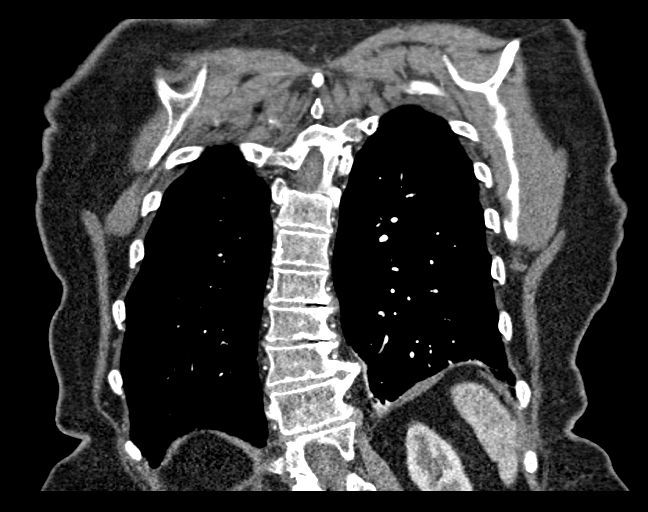

[17 of 46 positions shown; findings below may reference images not displayed]

FINDINGS: Cardiovascular: The heart is within normal limits in size for age.
No pericardial effusion. There is fusiform aneurysmal dilatation of
the ascending aorta with maximum diameter of 5.1 cm at the level of
the right main pulmonary artery. No dissection. Normal caliber of
the descending thoracic aorta. Minimal scattered calcifications at
the aortic arch. The branch vessels are patent. Scattered coronary
artery calcifications are noted.

The pulmonary arteries appear normal.

Mediastinum/Nodes: No mediastinal or hilar mass or adenopathy. The
esophagus is grossly normal.

Lungs/Pleura: Solitary 8 x 6 mm right upper lobe pulmonary nodule.
No other pulmonary lesions are identified. No acute pulmonary
findings. No pleural effusion.

Upper Abdomen: Patchy peripheral area of enhancement in segment 2 of
the liver is most likely a vascular shunt. No other significant
upper abdominal findings. Atherosclerotic calcifications involving
the upper abdominal aorta.

Musculoskeletal: No breast masses, supraclavicular or axillary
adenopathy. The thyroid gland is grossly normal. No significant bony
findings. Moderate degenerative changes involving the thoracic spine
along with a scoliosis.

Review of the MIP images confirms the above findings.
IMPRESSION: 1. Fusiform aneurysmal dilatation of the ascending aorta with
maximum diameter of 5.1 cm. No dissection. Ascending thoracic aortic
aneurysm. Recommend semi-annual imaging followup by CTA or MRA and
referral to cardiothoracic surgery if not already obtained. This
recommendation follows 8393
ACCF/AHA/AATS/ACR/ASA/SCA/REPLICA/JOSADAC/TIGER/DARIELA Guidelines for the
Diagnosis and Management of Patients With Thoracic Aortic Disease.
Circulation. 8393; 121: [id number]
2. Solitary 8 x 6 mm right upper lobe pulmonary nodule is
indeterminate. Smoothly marginated and slightly lobulated without
definite spiculation. Non-contrast chest CT at 6-12 months is
recommended. If the nodule is stable at time of repeat CT, then
future CT at 18-24 months (from today's scan) is considered optional
for low-risk patients, but is recommended for high-risk patients.
This recommendation follows the consensus statement: Guidelines for
Management of Incidental Pulmonary Nodules Detected on CT Images:
3. Scattered aortic and coronary artery calcifications.

Aortic Atherosclerosis (HC1CI-BQO.O).

## 2018-05-19 ENCOUNTER — Other Ambulatory Visit: Payer: Self-pay | Admitting: *Deleted

## 2018-05-19 DIAGNOSIS — I712 Thoracic aortic aneurysm, without rupture, unspecified: Secondary | ICD-10-CM

## 2018-07-08 DIAGNOSIS — N183 Chronic kidney disease, stage 3 (moderate): Secondary | ICD-10-CM | POA: Diagnosis not present

## 2018-07-08 DIAGNOSIS — E7800 Pure hypercholesterolemia, unspecified: Secondary | ICD-10-CM | POA: Diagnosis not present

## 2018-07-09 DIAGNOSIS — R609 Edema, unspecified: Secondary | ICD-10-CM | POA: Diagnosis not present

## 2018-07-09 DIAGNOSIS — Z1339 Encounter for screening examination for other mental health and behavioral disorders: Secondary | ICD-10-CM | POA: Diagnosis not present

## 2018-07-09 DIAGNOSIS — I712 Thoracic aortic aneurysm, without rupture: Secondary | ICD-10-CM | POA: Diagnosis not present

## 2018-07-09 DIAGNOSIS — I10 Essential (primary) hypertension: Secondary | ICD-10-CM | POA: Diagnosis not present

## 2018-07-09 DIAGNOSIS — Z9181 History of falling: Secondary | ICD-10-CM | POA: Diagnosis not present

## 2018-07-09 DIAGNOSIS — E7800 Pure hypercholesterolemia, unspecified: Secondary | ICD-10-CM | POA: Diagnosis not present

## 2018-07-09 DIAGNOSIS — Z1331 Encounter for screening for depression: Secondary | ICD-10-CM | POA: Diagnosis not present

## 2018-07-13 ENCOUNTER — Other Ambulatory Visit: Payer: Self-pay

## 2018-07-14 ENCOUNTER — Encounter: Payer: Self-pay | Admitting: Surgery

## 2018-07-14 ENCOUNTER — Ambulatory Visit (INDEPENDENT_AMBULATORY_CARE_PROVIDER_SITE_OTHER): Payer: Medicare Other | Admitting: Surgery

## 2018-07-14 ENCOUNTER — Ambulatory Visit
Admission: RE | Admit: 2018-07-14 | Discharge: 2018-07-14 | Disposition: A | Discharge status: Home / Self Care | Payer: Medicare Other | Source: Ambulatory Visit | Attending: Surgery | Admitting: Surgery

## 2018-07-14 VITALS — BP 188/74 | HR 66 | Temp 97.7°F | Ht 64.0 in | Wt 204.0 lb

## 2018-07-14 DIAGNOSIS — I712 Thoracic aortic aneurysm, without rupture, unspecified: Secondary | ICD-10-CM

## 2018-07-14 DIAGNOSIS — R911 Solitary pulmonary nodule: Secondary | ICD-10-CM | POA: Diagnosis not present

## 2018-07-14 MED ORDER — IOPAMIDOL (ISOVUE-370) INJECTION 76%
80.0000 mL | Freq: Once | INTRAVENOUS | Status: AC | PRN
  Administered 2018-07-14: 80 mL via INTRAVENOUS

## 2018-07-14 NOTE — Progress Notes (Signed)
HPI:  The patient is an 85 year old woman with a 5.1 to 5.2 cm fusiform ascending aortic aneurysm initially found on an echocardiogram done in January 2019.  She subsequently had a CTA of the chest in March 2019 which showed the aneurysm is well as an 8 x 6 mm right upper lobe pulmonary nodule.  I last saw her in November 2019 at which time CTA of the chest showed the aneurysm to be 5.2 cm.  Her echo had shown a trileaflet aortic valve with mildly thickened and calcified leaflets with no aortic stenosis and mild aortic insufficiency.  She continues to feel well but said that she gets tired towards the end of the day.  She has had no chest pain or pressure.  She denies any peripheral edema.  She has had no shortness of breath.  Current Outpatient Medications  Medication Sig Dispense Refill  . amLODipine (NORVASC) 5 MG tablet Take 1 tablet (5 mg total) by mouth daily. 90 tablet 3  . aspirin 81 MG tablet Take 81 mg by mouth daily.    [name] atorvastatin (LIPITOR) 10 MG tablet Take 10 mg by mouth daily.    . Biotin 1000 MCG tablet Take 1,000 mcg by mouth daily.    . calcium citrate-vitamin D (CITRACAL+D) 315-200 MG-UNIT tablet Take 1 tablet by mouth 2 (two) times daily.    . Cholecalciferol (VITAMIN D-3) 1000 units CAPS Take 1 capsule by mouth daily.    . cloNIDine (CATAPRES) 0.1 MG tablet Take 1 tablet (0.1 mg total) by mouth daily. 90 tablet 3  . furosemide (LASIX) 40 MG tablet Take 40 mg by mouth daily as needed.     [name] losartan (COZAAR) 100 MG tablet Take 100 mg by mouth daily.    . metoprolol succinate (TOPROL-XL) 100 MG 24 hr tablet Take 100 mg by mouth daily. Take with or immediately following a meal.    . Multiple Vitamins-Minerals (MULTIVITAMIN WITH MINERALS) tablet Take 1 tablet by mouth daily.     No current facility-administered medications for this visit.      Physical Exam: BP (!) 188/74 (BP Location: Left Arm, Cuff Size: Large)   Pulse 66   Temp 97.7 F (36.5 C) (Skin)   Ht 5'  4" (1.626 m)   Wt 204 lb (92.5 kg)   SpO2 96% Comment: RA  BMI 35.02 kg/m  She looks well. Cardiac exam shows a regular rate and rhythm with normal heart sounds.  There is no murmur. Lungs are clear. There is no peripheral edema.  Diagnostic Tests:  CLINICAL DATA:  Thoracic aortic aneurysm six-month follow-up  EXAM: CT ANGIOGRAPHY CHEST WITH CONTRAST  TECHNIQUE: Multidetector CT imaging of the chest was performed using the standard protocol during bolus administration of intravenous contrast. Multiplanar CT image reconstructions and MIPs were obtained to evaluate the vascular anatomy.  CONTRAST:  57mL ISOVUE-370 IOPAMIDOL (ISOVUE-370) INJECTION 76%  COMPARISON:  05/05/2017  FINDINGS: Cardiovascular: Preferential opacification of the thoracic aorta. No thoracic aortic dissection. Ascending thoracic aortic aneurysm measuring 5.3 cm in transverse diameter at the level of the right main pulmonary artery. Aortic root is stable measuring 3.8 cm. Proximal aortic arch measures 4.1 cm. Distal aortic arch measures 2.9 cm. Descending thoracic aorta measures 2.9 cm. 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 Normal heart size. No pericardial effusion. Mild thoracic aortic atherosclerosis. Coronary artery atherosclerosis in the LAD and left circumflex coronary artery. Pulmonary arteries are normal in caliber.  Mediastinum/Nodes:  No enlarged mediastinal, hilar, or axillary lymph nodes. Thyroid gland, trachea, and esophagus demonstrate no significant findings.  Lungs/Pleura: No focal consolidation, pleural effusion or pneumothorax. Stable 7 mm right upper lobe pulmonary nodule in the anterior segment. 2 stable pulmonary nodules at the left lung base measuring 6 mm each. Stable scarring at the left lung base.  Upper Abdomen: No acute abnormality. Stable hypervascular area in the anterior left hepatic lobe likely reflecting a small  vascular malformation.  Musculoskeletal: No acute osseous abnormality. No aggressive osseous lesion. Degenerative disc disease with disc height loss throughout the thoracic spine.  Review of the MIP images confirms the above findings.  IMPRESSION: 1. Stable ascending thoracic aortic aneurysm measuring 5.3 cm in transverse diameter. Ascending thoracic aortic aneurysm. Recommend semi-annual imaging followup by CTA or MRA and referral to cardiothoracic surgery if not already obtained. This recommendation follows 2010 ACCF/AHA/AATS/ACR/ASA/SCA/SCAI/SIR/STS/SVM Guidelines for the Diagnosis and Management of Patients With Thoracic Aortic Disease. Circulation. 2010; 121: J287-O676. Aortic aneurysm NOS (ICD10-I71.9) 2. Stable 7 mm right upper lobe pulmonary nodule. Stable lower lobe pulmonary nodules measuring 6 mm each. These can be continually followed at the same time as the ascending thoracic aortic aneurysm. 3.  Aortic Atherosclerosis (ICD10-I70.0). 4. Coronary artery atherosclerosis.   Electronically Signed   By: Kathreen Devoid   On: 07/14/2018 14:16   Impression:  This 85 year old woman has a fusiform ascending aortic aneurysm that measures 5.3 cm on her CTA of the chest today.  This was noted to be 5.1 cm on her CT scan in March 2019 and then 5.2 cm in November 2019.  I do not think there is been any significant change by my measurement.  She is 85 years old and this is still below the usual 5.5 cm surgical threshold.  I have recommended continued follow-up.  She brought a chart with all of her blood pressures over the past several months and they are typically running in the 120's to 140's.  Her blood pressure is up today in the office but she said she was nervous about the results of her CT scan.  I reviewed the CT images with her and answered all of her questions.  I stressed the importance of continued good blood pressure control and preventing further enlargement and aortic  dissection.  Her CT scan also shows a stable 7 mm right upper lobe pulmonary nodule as well as stable lower lobe pulmonary nodules bilaterally each measuring about 6 mm.  These can also be followed by CT scan.  Plan:  I will plan to see her back in 6 months with a CTA of the chest.  I spent 15 minutes performing this established patient evaluation and > 50% of this time was spent face to face counseling and coordinating the care of this patient's aortic aneurysm and bilateral pulmonary nodules.    Gaye Pollack, MD [facility] [phone]

## 2018-09-10 DIAGNOSIS — Z20828 Contact with and (suspected) exposure to other viral communicable diseases: Secondary | ICD-10-CM | POA: Diagnosis not present

## 2018-11-30 ENCOUNTER — Telehealth: Payer: Self-pay | Admitting: Cardiology

## 2018-11-30 DIAGNOSIS — I10 Essential (primary) hypertension: Secondary | ICD-10-CM

## 2018-11-30 DIAGNOSIS — Z23 Encounter for immunization: Secondary | ICD-10-CM | POA: Diagnosis not present

## 2018-11-30 MED ORDER — CLONIDINE HCL 0.2 MG PO TABS: 0.2000 mg | ORAL_TABLET | Freq: Every day | ORAL | 3 refills | Status: DC

## 2018-11-30 NOTE — Telephone Encounter (Signed)
Increase clonidine to .2mg  daily Kirk Ruths

## 2018-11-30 NOTE — Telephone Encounter (Signed)
Spoke with pt, Bridget Evans. She has a follow up next week with dr Stanford Breed and bring bp list to that appointment.

## 2018-11-30 NOTE — Telephone Encounter (Signed)
Patient states that she was told to let Dr.Crenshaw when her BP started increasing. She states for the past 10 days, it has went up. This morning it was 183/71, and then she waited and retook it after a little while and it was 135/67. Patient took her medications this morning.   Patient has no other symptoms- other than her head feeling funny. She denies chest pains, SOB or swelling.   Patient advised to hydrate- and to rest when she feels that her BP is high, and take deep breaths. Patient has been taking the blood pressure multiple times throughout the day or when it is high. Explained to only take it when feeling badly, or 2-3 times throughout the day and not back to back as this can cause some weird BP readings as well.  Advised I would route to MD for any changes or recommendations.  Patient verbalized understanding.

## 2018-11-30 NOTE — Telephone Encounter (Signed)
Pt c/o BP issue: STAT if pt c/o blurred vision, one-sided weakness or slurred speech  1. What are your last 5 BP readings? This morning it was 183/71, 135/67-- pt said Dr Stanford Breed told her to call if blood pressure started going back up  2. Are you having any other symptoms (ex. Dizziness, headache, blurred vision, passed out)?  Head feels funny this morning  3. What is your BP issue? Blood Pressure running high

## 2018-12-03 NOTE — Progress Notes (Signed)
HPI: FUTAA. Stress echocardiogram2/17normal. Seen for CP 12/18. Echo 1/19 showed normal LV function, grade 1 DD, mild AI, moderately dilated aortic root, mild MR and TR. CTA 3/19 showedfusiform aneurysmal dilatation of ascending aorta measuring 5.1 cm. There was a right upper lobe pulmonary nodule and follow-up recommended 6-12 months.Seen by Dr Cyndia Bent and FU CTs recommended. Follow-up CTA November 2019 showed stable aneurysmal dilatation of the ascending aorta at 5.2 cm.  The innominate was dilated at 2 cm and the right upper lobe lung nodule was stable as well. CTA June 2020 showed a sending thoracic aortic aneurysm measuring 5.3 cm.  Pulmonary nodules in the right upper lobe and left lower lobe unchanged.  Since last seen,there is no dyspnea, chest pain, palpitations or syncope.  Current Outpatient Medications  Medication Sig Dispense Refill  . amLODipine (NORVASC) 5 MG tablet Take 1 tablet (5 mg total) by mouth daily. 90 tablet 3  . aspirin 81 MG tablet Take 81 mg by mouth daily.    [name] atorvastatin (LIPITOR) 10 MG tablet Take 10 mg by mouth daily.    . Biotin 1000 MCG tablet Take 1,000 mcg by mouth daily.    . calcium citrate-vitamin D (CITRACAL+D) 315-200 MG-UNIT tablet Take 1 tablet by mouth 2 (two) times daily.    . Cholecalciferol (VITAMIN D-3) 1000 units CAPS Take 1 capsule by mouth daily.    . cloNIDine (CATAPRES) 0.2 MG tablet Take 1 tablet (0.2 mg total) by mouth daily. 90 tablet 3  . furosemide (LASIX) 40 MG tablet Take 40 mg by mouth daily as needed.     [name] losartan (COZAAR) 100 MG tablet Take 100 mg by mouth daily.    . metoprolol succinate (TOPROL-XL) 100 MG 24 hr tablet Take 100 mg by mouth daily. Take with or immediately following a meal.    . Multiple Vitamins-Minerals (MULTIVITAMIN WITH MINERALS) tablet Take 1 tablet by mouth daily.     No current facility-administered medications for this visit.      Past Medical History:  Diagnosis Date  . Calculus of kidney    . Chronic kidney disease    stage 3  . Edema   . Glaucoma   . Hyperlipidemia   . Hypertension   . Lumbar spondylosis   . Osteopenia   . Palpitations   . Postural kyphosis of lumbar region   . Pre-diabetes   . Rosacea   . Vitamin D deficiency     Past Surgical History:  Procedure Laterality Date  . ABDOMINAL HYSTERECTOMY    . COLON SURGERY  2008   mass removed sigmoid    Social History   Socioeconomic History  . Marital status: Married    Spouse name: Not on file  . Number of children: 2  . Years of education: Not on file  . Highest education level: Not on file  Occupational History  . Not on file  Social Needs  . Financial resource strain: Not on file  . Food insecurity    Worry: Not on file    Inability: Not on file  . Transportation needs    Medical: Not on file    Non-medical: Not on file  Tobacco Use  . Smoking status: Never Smoker  . Smokeless tobacco: Never Used  Substance and Sexual Activity  . Alcohol use: No    Alcohol/week: 0.0 standard drinks  . Drug use: Never  . Sexual activity: Not on file  Lifestyle  . Physical activity  Days per week: Not on file    Minutes per session: Not on file  . Stress: Not on file  Relationships  . Social Herbalist on phone: Not on file    Gets together: Not on file    Attends religious service: Not on file    Active member of club or organization: Not on file    Attends meetings of clubs or organizations: Not on file    Relationship status: Not on file  . Intimate partner violence    Fear of current or ex partner: Not on file    Emotionally abused: Not on file    Physically abused: Not on file    Forced sexual activity: Not on file  Other Topics Concern  . Not on file  Social History Narrative  . Not on file    Family History  Problem Relation Age of Onset  . CVA Mother   . Cancer Father   . Cancer Sister   . Heart attack Brother   . Colon cancer Brother   . Breast cancer Sister      ROS: no fevers or chills, productive cough, hemoptysis, dysphasia, odynophagia, melena, hematochezia, dysuria, hematuria, rash, seizure activity, orthopnea, PND, pedal edema, claudication. Remaining systems are negative.  Physical Exam: Well-developed well-nourished in no acute distress.  Skin is warm and dry.  HEENT is normal.  Neck is supple.  Chest is clear to auscultation with normal expansion.  Cardiovascular exam is regular rate and rhythm.  Abdominal exam nontender or distended. No masses palpated. Extremities show no edema. neuro grossly intact  A/P  1 thoracic aortic aneurysm-patient will need follow-up CTA 12/20.  2 hypertension-blood pressure elevated; increase amlodipine to 10 mg daily and follow.  3 hyperlipidemia-continue statin.  4 pulmonary nodules-follow-up CT June 2021.  Kirk Ruths, MD

## 2018-12-07 ENCOUNTER — Ambulatory Visit (INDEPENDENT_AMBULATORY_CARE_PROVIDER_SITE_OTHER): Payer: Medicare Other | Admitting: Cardiology

## 2018-12-07 ENCOUNTER — Other Ambulatory Visit: Payer: Self-pay

## 2018-12-07 ENCOUNTER — Encounter: Payer: Self-pay | Admitting: Cardiology

## 2018-12-07 VITALS — BP 148/72 | HR 67 | Temp 97.1°F | Ht 64.0 in | Wt 204.0 lb

## 2018-12-07 DIAGNOSIS — I712 Thoracic aortic aneurysm, without rupture, unspecified: Secondary | ICD-10-CM

## 2018-12-07 DIAGNOSIS — I10 Essential (primary) hypertension: Secondary | ICD-10-CM | POA: Diagnosis not present

## 2018-12-07 DIAGNOSIS — E7800 Pure hypercholesterolemia, unspecified: Secondary | ICD-10-CM

## 2018-12-07 MED ORDER — AMLODIPINE BESYLATE 10 MG PO TABS: 10.0000 mg | ORAL_TABLET | Freq: Every day | ORAL | 3 refills | Status: DC

## 2018-12-07 NOTE — Patient Instructions (Signed)
Medication Instructions:  INCREASE AMLODIPINE TO 10 MG ONCE DAILY=2 OF THE 5 MG TABLETS ONCE DAILY  *If you need a refill on your cardiac medications before your next appointment, please call your pharmacy*  Lab Work: If you have labs (blood work) drawn today and your tests are completely normal, you will receive your results only by: [name] MyChart Message (if you have MyChart) OR . A paper copy in the mail If you have any lab test that is abnormal or we need to change your treatment, we will call you to review the results.  Follow-Up: At [facility], you and your health needs are our priority.  As part of our continuing mission to provide you with exceptional heart care, we have created designated Provider Care Teams.  These Care Teams include your primary Cardiologist (physician) and Advanced Practice Providers (APPs -  Physician Assistants and Nurse Practitioners) who all work together to provide you with the care you need, when you need it.  Your next appointment:   6 months  The format for your next appointment:   In Person  Provider:   Kirk Ruths, MD

## 2018-12-28 ENCOUNTER — Other Ambulatory Visit: Payer: Self-pay | Admitting: *Deleted

## 2018-12-28 DIAGNOSIS — I712 Thoracic aortic aneurysm, without rupture, unspecified: Secondary | ICD-10-CM

## 2018-12-28 NOTE — Progress Notes (Unsigned)
Ct a 

## 2019-01-19 ENCOUNTER — Ambulatory Visit
Admission: RE | Admit: 2019-01-19 | Discharge: 2019-01-19 | Disposition: A | Discharge status: Home / Self Care | Payer: Medicare Other | Source: Ambulatory Visit | Attending: Surgery | Admitting: Surgery

## 2019-01-19 ENCOUNTER — Encounter: Payer: Self-pay | Admitting: Surgery

## 2019-01-19 ENCOUNTER — Other Ambulatory Visit: Payer: Self-pay

## 2019-01-19 ENCOUNTER — Ambulatory Visit (INDEPENDENT_AMBULATORY_CARE_PROVIDER_SITE_OTHER): Payer: Medicare Other | Admitting: Surgery

## 2019-01-19 VITALS — BP 170/72 | HR 62 | Temp 97.6°F | Resp 20 | Ht 64.0 in | Wt 204.0 lb

## 2019-01-19 DIAGNOSIS — I712 Thoracic aortic aneurysm, without rupture, unspecified: Secondary | ICD-10-CM

## 2019-01-19 DIAGNOSIS — I700 Atherosclerosis of aorta: Secondary | ICD-10-CM | POA: Diagnosis not present

## 2019-01-19 DIAGNOSIS — R911 Solitary pulmonary nodule: Secondary | ICD-10-CM

## 2019-01-19 MED ORDER — IOPAMIDOL (ISOVUE-370) INJECTION 76%
75.0000 mL | Freq: Once | INTRAVENOUS | Status: AC | PRN
  Administered 2019-01-19: 75 mL via INTRAVENOUS

## 2019-01-19 NOTE — Progress Notes (Signed)
HPI:  The patient is an 85 year old woman with a 5.3 cm fusiform ascending aortic aneurysm that was measured at 5.1 cm on CT scan in March 2019 and then 5.2 cm in November 2019.  Since I last saw her in June 2020 she has been feeling well without chest pain or shortness of breath.  Current Outpatient Medications  Medication Sig Dispense Refill  . amLODipine (NORVASC) 10 MG tablet Take 1 tablet (10 mg total) by mouth daily. 90 tablet 3  . aspirin 81 MG tablet Take 81 mg by mouth daily.    [name] atorvastatin (LIPITOR) 10 MG tablet Take 10 mg by mouth daily.    . Biotin 1000 MCG tablet Take 1,000 mcg by mouth daily.    . calcium citrate-vitamin D (CITRACAL+D) 315-200 MG-UNIT tablet Take 1 tablet by mouth 2 (two) times daily.    . Cholecalciferol (VITAMIN D-3) 1000 units CAPS Take 1 capsule by mouth daily.    . cloNIDine (CATAPRES) 0.2 MG tablet Take 1 tablet (0.2 mg total) by mouth daily. 90 tablet 3  . furosemide (LASIX) 40 MG tablet Take 40 mg by mouth daily as needed.     [name] losartan (COZAAR) 100 MG tablet Take 100 mg by mouth daily.    . metoprolol succinate (TOPROL-XL) 100 MG 24 hr tablet Take 100 mg by mouth daily. Take with or immediately following a meal.    . Multiple Vitamins-Minerals (MULTIVITAMIN WITH MINERALS) tablet Take 1 tablet by mouth daily.     No current facility-administered medications for this visit.      Physical Exam:  BP (!) 170/72   Pulse 62   Temp 97.6 F (36.4 C) (Skin)   Resp 20   Ht 5\' 4"  (1.626 m)   Wt 204 lb (92.5 kg)   SpO2 95% Comment: RA  BMI 35.02 kg/m  She looks well. Cardiac exam shows a regular rate and rhythm with normal heart sounds.  There is no murmur. Lungs are clear.  Diagnostic Tests:  CLINICAL DATA:  Thoracic aortic aneurysm.  EXAM: CT ANGIOGRAPHY CHEST WITH CONTRAST  TECHNIQUE: Multidetector CT imaging of the chest was performed using the standard protocol during bolus administration of intravenous contrast.  Multiplanar CT image reconstructions and MIPs were obtained to evaluate the vascular anatomy.  CONTRAST:  79mL ISOVUE-370 IOPAMIDOL (ISOVUE-370) INJECTION 76%  COMPARISON:  July 14, 2018.  FINDINGS: Cardiovascular: Grossly stable 5.2 cm ascending thoracic aortic aneurysm is noted. No dissection is noted. Atherosclerosis of thoracic aorta is noted. Great vessels are widely patent. Normal cardiac size. No pericardial effusion.  Mediastinum/Nodes: No enlarged mediastinal, hilar, or axillary lymph nodes. Thyroid gland, trachea, and esophagus demonstrate no significant findings.  Lungs/Pleura: No pneumothorax or pleural effusion is noted. Grossly stable 8 mm nodule is noted in right upper lobe best seen on image number 61 of series 7. Left lung is clear.  Upper Abdomen: No acute abnormality.  Musculoskeletal: No chest wall abnormality. No acute or significant osseous findings.  Review of the MIP images confirms the above findings.  IMPRESSION: Grossly stable 5.2 cm ascending thoracic aortic aneurysm. Recommend semi-annual imaging followup by CTA or MRA and referral to cardiothoracic surgery if not already obtained. This recommendation follows 2010 ACCF/AHA/AATS/ACR/ASA/SCA/SCAI/SIR/STS/SVM Guidelines for the Diagnosis and Management of Patients With Thoracic Aortic Disease. Circulation. 2010; 121JN:[id number]. Aortic aneurysm NOS (ICD10-I71.9).  Grossly stable 8 mm nodule is noted in right upper lobe. Attention to this on follow-up imaging is recommended as well.  Aortic Atherosclerosis (  ICD10-I70.0).   Electronically Signed   By: Marijo Conception M.D.   On: 01/19/2019 13:32   Impression:  This 85 year old woman has a stable 5.2 cm fusiform ascending aortic aneurysm that has not changed significantly dating back to March 2019.  Her blood pressure is somewhat elevated in the office today but she did bring a chart with all of her blood pressures that she has been  checking over the past month.  Most most of these have been under good control with systolic blood pressures in the 110-130 range.  She has had a couple systolic blood pressures of 150.  Her aneurysm is still below the surgical threshold of 5.5 cm.  I reviewed the CT images with her and answered all her questions.  I stressed the importance of continued good blood pressure control with her in preventing further enlargement and acute aortic dissection.  Plan:  I will see her back in 6 months with a CTA of the chest.   I spent 15 minutes performing this established patient evaluation and > 50% of this time was spent face to face counseling and coordinating the care of this patient's aortic aneurysm.    Gaye Pollack, MD [facility] [phone]

## 2019-03-14 DIAGNOSIS — Z139 Encounter for screening, unspecified: Secondary | ICD-10-CM | POA: Diagnosis not present

## 2019-03-14 DIAGNOSIS — I10 Essential (primary) hypertension: Secondary | ICD-10-CM | POA: Diagnosis not present

## 2019-03-14 DIAGNOSIS — R609 Edema, unspecified: Secondary | ICD-10-CM | POA: Diagnosis not present

## 2019-03-14 DIAGNOSIS — E7800 Pure hypercholesterolemia, unspecified: Secondary | ICD-10-CM | POA: Diagnosis not present

## 2019-03-14 DIAGNOSIS — I712 Thoracic aortic aneurysm, without rupture: Secondary | ICD-10-CM | POA: Diagnosis not present

## 2019-06-08 NOTE — Progress Notes (Signed)
HPI: FUTAA. Stress echocardiogram2/17normal. Seen for CP 12/18. Echo 1/19 showed normal LV function, grade 1 DD, mild AI, moderately dilated aortic root, mild MR and TR. CTA 3/19 showedfusiform aneurysmal dilatation of ascending aorta measuring 5.1 cm. There was a right upper lobe pulmonary nodule and follow-up recommended 6-12 months.Seen by Dr Cyndia Bent and FU CTs recommended. Last chest CT December 2020 showed 5.2 cm thoracic aortic aneurysm and stable 8 mm nodule in the right upper lobe. Since last seen,she has some fatigue and she has noticed increased bilateral lower extremity edema right greater than left.  No chest pain or syncope.  Current Outpatient Medications  Medication Sig Dispense Refill  . amLODipine (NORVASC) 10 MG tablet Take 1 tablet (10 mg total) by mouth daily. 90 tablet 3  . aspirin 81 MG tablet Take 81 mg by mouth daily.    [name] atorvastatin (LIPITOR) 10 MG tablet Take 10 mg by mouth daily.    . Biotin 1000 MCG tablet Take 1,000 mcg by mouth daily.    . calcium citrate-vitamin D (CITRACAL+D) 315-200 MG-UNIT tablet Take 1 tablet by mouth 2 (two) times daily.    . Cholecalciferol (VITAMIN D-3) 1000 units CAPS Take 1 capsule by mouth daily.    . cloNIDine (CATAPRES) 0.2 MG tablet Take 1 tablet (0.2 mg total) by mouth daily. 90 tablet 3  . furosemide (LASIX) 40 MG tablet Take 40 mg by mouth daily as needed.     [name] losartan (COZAAR) 100 MG tablet Take 100 mg by mouth daily.    . metoprolol succinate (TOPROL-XL) 100 MG 24 hr tablet Take 100 mg by mouth daily. Take with or immediately following a meal.    . Multiple Vitamins-Minerals (MULTIVITAMIN WITH MINERALS) tablet Take 1 tablet by mouth daily.     No current facility-administered medications for this visit.     Past Medical History:  Diagnosis Date  . Calculus of kidney   . Chronic kidney disease    stage 3  . Edema   . Glaucoma   . Hyperlipidemia   . Hypertension   . Lumbar spondylosis   . Osteopenia   .  Palpitations   . Postural kyphosis of lumbar region   . Pre-diabetes   . Rosacea   . Vitamin D deficiency     Past Surgical History:  Procedure Laterality Date  . ABDOMINAL HYSTERECTOMY    . COLON SURGERY  2008   mass removed sigmoid    Social History   Socioeconomic History  . Marital status: Married    Spouse name: Not on file  . Number of children: 2  . Years of education: Not on file  . Highest education level: Not on file  Occupational History  . Not on file  Tobacco Use  . Smoking status: Never Smoker  . Smokeless tobacco: Never Used  Substance and Sexual Activity  . Alcohol use: No    Alcohol/week: 0.0 standard drinks  . Drug use: Never  . Sexual activity: Not on file  Other Topics Concern  . Not on file  Social History Narrative  . Not on file   Social Determinants of Health   Financial Resource Strain:   . Difficulty of Paying Living Expenses:   Food Insecurity:   . Worried About Charity fundraiser in the Last Year:   . Arboriculturist in the Last Year:   Transportation Needs:   . Film/video editor (Medical):   [name] Lack of Transportation (  Non-Medical):   Physical Activity:   . Days of Exercise per Week:   . Minutes of Exercise per Session:   Stress:   . Feeling of Stress :   Social Connections:   . Frequency of Communication with Friends and Family:   . Frequency of Social Gatherings with Friends and Family:   . Attends Religious Services:   . Active Member of Clubs or Organizations:   . Attends Archivist Meetings:   [name] Marital Status:   Intimate Partner Violence:   . Fear of Current or Ex-Partner:   . Emotionally Abused:   [name] Physically Abused:   . Sexually Abused:     Family History  Problem Relation Age of Onset  . CVA Mother   . Cancer Father   . Cancer Sister   . Heart attack Brother   . Colon cancer Brother   . Breast cancer Sister     ROS: no fevers or chills, productive cough, hemoptysis, dysphasia, odynophagia,  melena, hematochezia, dysuria, hematuria, rash, seizure activity, orthopnea, PND, claudication. Remaining systems are negative.  Physical Exam: Well-developed well-nourished in no acute distress.  Skin is warm and dry.  HEENT is normal.  Neck is supple.  Chest is clear to auscultation with normal expansion.  Cardiovascular exam is regular rate and rhythm.  Abdominal exam nontender or distended. No masses palpated. Extremities show 1+ edema. neuro grossly intact  ECG-sinus rhythm at a rate of 68, occasional PAC, no ST changes.  Personally reviewed  A/P  1 thoracic aortic aneurysm-plan follow-up CTA June 2021.  Followed by cardiothoracic surgery.  2 hypertension-patient's blood pressure is controlled.  However she has worsening bilateral lower extremity edema and is now taking Lasix 40 mg daily.  Amlodipine could be contributing.  We will discontinue and instead treat with hydralazine 25 mg p.o. 3 times daily.  Follow blood pressure and adjust regimen as needed.  3 hyperlipidemia-continue statin.  4 pulmonary nodules-plan follow-up chest CT as outlined above.  5 lower extremity edema-worse compared to previous.  Change antihypertensive regimen as outlined above to see if this helps.  She will continue Lasix and we will check potassium and renal function.  Repeat echocardiogram.  Kirk Ruths, MD

## 2019-06-09 ENCOUNTER — Encounter: Payer: Self-pay | Admitting: Cardiology

## 2019-06-09 ENCOUNTER — Other Ambulatory Visit: Payer: Self-pay

## 2019-06-09 ENCOUNTER — Ambulatory Visit (INDEPENDENT_AMBULATORY_CARE_PROVIDER_SITE_OTHER): Payer: Medicare Other | Admitting: Cardiology

## 2019-06-09 VITALS — BP 148/64 | HR 68 | Ht 64.0 in | Wt 204.0 lb

## 2019-06-09 DIAGNOSIS — R601 Generalized edema: Secondary | ICD-10-CM

## 2019-06-09 DIAGNOSIS — E7800 Pure hypercholesterolemia, unspecified: Secondary | ICD-10-CM | POA: Diagnosis not present

## 2019-06-09 DIAGNOSIS — I712 Thoracic aortic aneurysm, without rupture, unspecified: Secondary | ICD-10-CM

## 2019-06-09 DIAGNOSIS — I10 Essential (primary) hypertension: Secondary | ICD-10-CM

## 2019-06-09 MED ORDER — HYDRALAZINE HCL 25 MG PO TABS
25.0000 mg | ORAL_TABLET | Freq: Three times a day (TID) | ORAL | 3 refills | Status: DC
Start: 2019-06-09 — End: 2019-07-14

## 2019-06-09 NOTE — Patient Instructions (Signed)
Medication Instructions:  STOP AMLODIPINE  START HYDRALAZINE 25 MG ONE TABLET THREE TIMES DAILY  *If you need a refill on your cardiac medications before your next appointment, please call your pharmacy*   Lab Work: Your physician recommends that you return for lab work AT [facility]  If you have labs (blood work) drawn today and your tests are completely normal, you will receive your results only by: [name] MyChart Message (if you have MyChart) OR . A paper copy in the mail If you have any lab test that is abnormal or we need to change your treatment, we will call you to review the results.   Testing/Procedures: Your physician has requested that you have an echocardiogram. Echocardiography is a painless test that uses sound waves to create images of your heart. It provides your doctor with information about the size and shape of your heart and how well your heart's chambers and valves are working. This procedure takes approximately one hour. There are no restrictions for this procedure.[place]     Follow-Up: At [facility], you and your health needs are our priority.  As part of our continuing mission to provide you with exceptional heart care, we have created designated Provider Care Teams.  These Care Teams include your primary Cardiologist (physician) and Advanced Practice Providers (APPs -  Physician Assistants and Nurse Practitioners) who all work together to provide you with the care you need, when you need it.  We recommend signing up for the patient portal called "MyChart".  Sign up information is provided on this After Visit Summary.  MyChart is used to connect with patients for Virtual Visits (Telemedicine).  Patients are able to view lab/test results, encounter notes, upcoming appointments, etc.  Non-urgent messages can be sent to your provider as well.   To learn more about what you can do with MyChart, go to  [email or web address].    Your next appointment:   3 month(s)  The format for your next appointment:   In Person  Provider:   Kirk Ruths, MD

## 2019-06-15 ENCOUNTER — Telehealth: Payer: Self-pay | Admitting: Cardiology

## 2019-06-15 NOTE — Telephone Encounter (Signed)
Spoke with patient and she started Hydralazine 25 mg three times a day on Monday Today is the first day she has checked her blood pressure  This am SBP 150  She took her Hydralazine at 2  Blood pressure currently 180/80, head "feels funny" Confirmed taking other blood pressure medications as directed Will forward to Pharm D for review

## 2019-06-15 NOTE — Telephone Encounter (Signed)
° °  Pt c/o medication issue:  1. Name of Medication:   hydrALAZINE (APRESOLINE) 25 MG tablet    2. How are you currently taking this medication (dosage and times per day)?   3. Are you having a reaction (difficulty breathing--STAT)?   4. What is your medication issue? Pt said she started this medication Monday morning, today her BP is up. She just check it now her BP is at 180/80.

## 2019-06-15 NOTE — Telephone Encounter (Signed)
If she continues to feel funny with her pressure that high, she may just need to go to ED.  She should continue with meds and not check her BP more than twice daily.  We can add her in to the PharmD schedule next week if she continues to see some elevated readings.

## 2019-06-15 NOTE — Telephone Encounter (Signed)
Advised patient, verbalized understanding  Blood pressure 153/67 at last reading, feeling better

## 2019-06-20 ENCOUNTER — Other Ambulatory Visit: Payer: Self-pay | Admitting: Surgery

## 2019-06-20 DIAGNOSIS — I712 Thoracic aortic aneurysm, without rupture, unspecified: Secondary | ICD-10-CM

## 2019-06-21 ENCOUNTER — Ambulatory Visit (INDEPENDENT_AMBULATORY_CARE_PROVIDER_SITE_OTHER): Payer: Medicare Other | Admitting: Pharmacist Clinician (PhC)/ Clinical Pharmacy Specialist

## 2019-06-21 ENCOUNTER — Other Ambulatory Visit: Payer: Self-pay

## 2019-06-21 DIAGNOSIS — I10 Essential (primary) hypertension: Secondary | ICD-10-CM | POA: Diagnosis not present

## 2019-06-21 MED ORDER — AMLODIPINE BESYLATE 5 MG PO TABS: 5.0000 mg | ORAL_TABLET | Freq: Every day | ORAL | 3 refills | Status: AC

## 2019-06-21 MED ORDER — VALSARTAN 320 MG PO TABS: ORAL_TABLET | ORAL | 3 refills | Status: DC

## 2019-06-21 NOTE — Patient Instructions (Signed)
  Go to the lab next week.   Check your blood pressure at home once or twice daily and keep record of the readings.  Take your BP meds as follows:  Stop losartan and hydralzine   Start amlodipine 5 mg (1/2 of 10 mg tablet) in the afternoon/evenings.  Start valsartan 320 mg once daily in the mornings.   Continue with metoprolol and clonidine for now.    Bring your record of home blood pressures to your next appointment.  Exercise as you're able, try to walk approximately 30 minutes per day.  Keep salt intake to a minimum, especially watch canned and prepared boxed foods.  Eat more fresh fruits and vegetables and fewer canned items.  Avoid eating in fast food restaurants.    HOW TO TAKE YOUR BLOOD PRESSURE: . Rest 5 minutes before taking your blood pressure. .  Don't smoke or drink caffeinated beverages for at least 30 minutes before. . Take your blood pressure before (not after) you eat. . Sit comfortably with your back supported and both feet on the floor (don't cross your legs). . Elevate your arm to heart level on a table or a desk. . Use the proper sized cuff. It should fit smoothly and snugly around your bare upper arm. There should be enough room to slip a fingertip under the cuff. The bottom edge of the cuff should be 1 inch above the crease of the elbow. . Ideally, take 3 measurements at one sitting and record the average.

## 2019-06-21 NOTE — Progress Notes (Signed)
06/23/2019 Bridget Evans Aug 11, 1931 [id number]   HPI:  Bridget Evans is a 86 y.o. female patient of Dr Stanford Breed, with a PMH below who presents today for hypertension clinic evaluation.  In addition to hypertension her medical history is significant for hyperlipidemia and thoracic aortic aneurysm.  Dr. Stanford Breed saw patient on April 29 and she had a pressure of 148/64.  She was taking amlodipine 10 mg at that time.  Because of ongoing LEE, he stopped that and started her on hydralazine 25 mg tid.  She called about a week later to report feeling "funny" with a BP higher in the 180's  It was recommended that she go to ED if felt bad, but otherwise check home BP no more than twice daily.   She is here today for follow up.  Feels pressure higher since starting hydralazine, not stayed same or lower Preferred amlodipine even though had some ankle swelling - started in 2013, edema just in past few months.  Got by with furosemide 40 about once weekly  Blood Pressure Goal:  130/80  Current Medications: clonidine 0.2 mg qam, losartan 100 mg qd, metoprolol succ 100 mg qd, hydralazine 25 mg tid. (6 am 2pm 10 pm)  Family Hx: no family history of hypertension that she is aware of (7 siblings, she is 6th); 2 sons - older with hypertension, younger had gastric bypass, not successful  Social Hx: no tobaco, or alcohol; 2-3 cups of coffee per day, no caffeinated sodas; flavored waters  Diet: mostly home cooked foods, not a lot of added salt, some casseroles with soup mix have sodium, uses Heart healthy soups; veggies mostly frozen or fresh; snackss on crackers, peanuts (lightly salted)  Exercise: no regular exercise  Home BP readings: pt brings in multiple twice daily readings. Looking at the first and last readings from each day she averaged 144/67 with home readings.    Intolerances: nkda  Labs: 2/21: Na 140, K 4.8, Glu 109, BUN 21, SCr 0.97  Wt Readings from Last 3 Encounters:  06/09/19 204 lb  (92.5 kg)  01/19/19 204 lb (92.5 kg)  12/07/18 204 lb (92.5 kg)   BP Readings from Last 3 Encounters:  06/21/19 (!) 170/66  06/09/19 (!) 148/64  01/19/19 (!) 170/72   Pulse Readings from Last 3 Encounters:  06/21/19 64  06/09/19 68  01/19/19 62    Current Outpatient Medications  Medication Sig Dispense Refill  . amLODipine (NORVASC) 5 MG tablet Take 1 tablet (5 mg total) by mouth daily. 90 tablet 3  . aspirin 81 MG tablet Take 81 mg by mouth daily.    [name] atorvastatin (LIPITOR) 10 MG tablet Take 10 mg by mouth daily.    . Biotin 1000 MCG tablet Take 1,000 mcg by mouth daily.    . calcium citrate-vitamin D (CITRACAL+D) 315-200 MG-UNIT tablet Take 1 tablet by mouth 2 (two) times daily.    . Cholecalciferol (VITAMIN D-3) 1000 units CAPS Take 1 capsule by mouth daily.    . cloNIDine (CATAPRES) 0.2 MG tablet Take 1 tablet (0.2 mg total) by mouth daily. 90 tablet 3  . furosemide (LASIX) 40 MG tablet Take 40 mg by mouth daily as needed.     . hydrALAZINE (APRESOLINE) 25 MG tablet Take 1 tablet (25 mg total) by mouth 3 (three) times daily. 270 tablet 3  . losartan (COZAAR) 100 MG tablet Take 100 mg by mouth daily.    . metoprolol succinate (TOPROL-XL) 100 MG 24 hr tablet  Take 100 mg by mouth daily. Take with or immediately following a meal.    . Multiple Vitamins-Minerals (MULTIVITAMIN WITH MINERALS) tablet Take 1 tablet by mouth daily.    . valsartan (DIOVAN) 320 MG tablet Take 1 tablet by mouth daily in the mornings 30 tablet 3   No current facility-administered medications for this visit.    No Known Allergies  Past Medical History:  Diagnosis Date  . Calculus of kidney   . Chronic kidney disease    stage 3  . Edema   . Glaucoma   . Hyperlipidemia   . Hypertension   . Lumbar spondylosis   . Osteopenia   . Palpitations   . Postural kyphosis of lumbar region   . Pre-diabetes   . Rosacea   . Vitamin D deficiency     Blood pressure (!) 170/66, pulse 64, temperature 98.2 F  (36.8 C).  Essential hypertension Patient with systolic hypertension, not tolerating hydralazine well.  Will have her stop the hydralazine and losartan.  She will re-start amlodipine at 5 mg daily instead of 10 mg (she didn't thing the edema was significant). We will also start her on valsartan 320 mg once daily.  She already has plans to get blood work from PCP next week.  She should continue with all other medications as well as once or twice daily home BP monitoring.  We will see her back in a month for follow up.  With her age, would ideally like to get her off the clonidine, may taper that down if her home readings improve by her next visit.     Tommy Medal PharmD CPP [facility] [address] [place], [place] [address] [phone]

## 2019-06-22 ENCOUNTER — Encounter: Payer: Self-pay | Admitting: Pharmacist Clinician (PhC)/ Clinical Pharmacy Specialist

## 2019-06-23 NOTE — Assessment & Plan Note (Signed)
Patient with systolic hypertension, not tolerating hydralazine well.  Will have her stop the hydralazine and losartan.  She will re-start amlodipine at 5 mg daily instead of 10 mg (she didn't thing the edema was significant). We will also start her on valsartan 320 mg once daily.  She already has plans to get blood work from PCP next week.  She should continue with all other medications as well as once or twice daily home BP monitoring.  We will see her back in a month for follow up.  With her age, would ideally like to get her off the clonidine, may taper that down if her home readings improve by her next visit.

## 2019-06-28 ENCOUNTER — Other Ambulatory Visit: Payer: Medicare Other

## 2019-06-28 ENCOUNTER — Other Ambulatory Visit: Payer: Self-pay

## 2019-06-28 ENCOUNTER — Ambulatory Visit (HOSPITAL_COMMUNITY): Payer: Medicare Other | Attending: Cardiovascular Disease

## 2019-06-28 DIAGNOSIS — R601 Generalized edema: Secondary | ICD-10-CM | POA: Diagnosis not present

## 2019-06-29 LAB — BASIC METABOLIC PANEL
BUN/Creatinine Ratio: 21 (ref 12–28)
BUN: 21 mg/dL (ref 8–27)
CO2: 23 mmol/L (ref 20–29)
Calcium: 10.2 mg/dL (ref 8.7–10.3)
Chloride: 105 mmol/L (ref 96–106)
Creatinine, Ser: 1.01 mg/dL — ABNORMAL HIGH (ref 0.57–1.00)
GFR calc Af Amer: 58 mL/min/{1.73_m2} — ABNORMAL LOW (ref >59)
GFR calc non Af Amer: 51 mL/min/{1.73_m2} — ABNORMAL LOW (ref >59)
Glucose: 100 mg/dL — ABNORMAL HIGH (ref 65–99)
Potassium: 4.5 mmol/L (ref 3.5–5.2)
Sodium: 139 mmol/L (ref 134–144)

## 2019-07-14 ENCOUNTER — Ambulatory Visit (INDEPENDENT_AMBULATORY_CARE_PROVIDER_SITE_OTHER): Payer: Medicare Other | Admitting: Pharmacist

## 2019-07-14 ENCOUNTER — Other Ambulatory Visit: Payer: Self-pay

## 2019-07-14 VITALS — BP 140/60 | HR 69 | Temp 98.0°F | Resp 16 | Ht 64.0 in | Wt 203.0 lb

## 2019-07-14 DIAGNOSIS — I10 Essential (primary) hypertension: Secondary | ICD-10-CM | POA: Diagnosis not present

## 2019-07-14 NOTE — Progress Notes (Signed)
HPI:  Bridget Evans is a 86 y.o. female patient of Dr Stanford Breed, with a PMH below who presents today for hypertension clinic follow up.  In addition to hypertension her medical history is significant for hyperlipidemia and thoracic aortic aneurysm.  Dr. Stanford Breed saw patient on April 29 and she had a pressure of 148/64.  She was taking amlodipine 10 mg at that time.  Because of ongoing LEE, he stopped that and started her on hydralazine 25 mg tid.  She called about a week later to report feeling "funny" with a BP higher in the 180's  It was recommended that she go to ED if felt bad, but otherwise check home BP no more than twice daily.  During HTN follow up we resumed amlodipine at 5mg  daily and losartan 100mg  was changed to valsartan 320mg  daily. She is here today for follow up and denies problems with current regimen.   Blood Pressure Goal:  130/80  Current Medications: Amlodipine 5mg  daily in evening clonidine 0.2 mg every in monring valsartan 320mg  daily morning metoprolol succ 100 mg daily   Family Hx: no family history of hypertension that she is aware of (7 siblings, she is 6th); 2 sons - older with hypertension, younger had gastric bypass, not successful  Social Hx: no tobaco, or alcohol; 2-3 cups of coffee per day, no caffeinated sodas; flavored waters  Diet: mostly home cooked foods, not a lot of added salt, some casseroles with soup mix have sodium, uses Heart healthy soups; veggies mostly frozen or fresh; snackss on crackers, peanuts (lightly salted)  Exercise: activities of daily living, and yard work  Home BP readings 20 morning readings: average 134/66; HR 55-76bpm 15 evening readings: average 128/62; HR 58-70bpm  Intolerances: nkda  Labs: 06/28/2019: Na 139, K 4.5, Cl 105, BUN 21, Scr 1.01   Wt Readings from Last 3 Encounters:  07/14/19 203 lb (92.1 kg)  06/09/19 204 lb (92.5 kg)  01/19/19 204 lb (92.5 kg)   BP Readings from Last 3 Encounters:  07/14/19 140/60    06/21/19 (!) 170/66  06/09/19 (!) 148/64   Pulse Readings from Last 3 Encounters:  07/14/19 69  06/21/19 64  06/09/19 68    Current Outpatient Medications  Medication Sig Dispense Refill  . amLODipine (NORVASC) 5 MG tablet Take 1 tablet (5 mg total) by mouth daily. 90 tablet 3  . aspirin 81 MG tablet Take 81 mg by mouth daily.    [name] atorvastatin (LIPITOR) 10 MG tablet Take 10 mg by mouth daily.    . Biotin 1000 MCG tablet Take 1,000 mcg by mouth daily.    . calcium citrate-vitamin D (CITRACAL+D) 315-200 MG-UNIT tablet Take 1 tablet by mouth 2 (two) times daily.    . Cholecalciferol (VITAMIN D-3) 1000 units CAPS Take 1 capsule by mouth daily.    . cloNIDine (CATAPRES) 0.2 MG tablet Take 1 tablet (0.2 mg total) by mouth daily. 90 tablet 3  . furosemide (LASIX) 40 MG tablet Take 40 mg by mouth daily as needed.     . metoprolol succinate (TOPROL-XL) 100 MG 24 hr tablet Take 100 mg by mouth daily. Take with or immediately following a meal.    . Multiple Vitamins-Minerals (MULTIVITAMIN WITH MINERALS) tablet Take 1 tablet by mouth daily.    . valsartan (DIOVAN) 320 MG tablet Take 1 tablet by mouth daily in the mornings 30 tablet 3   No current facility-administered medications for this visit.    No Known Allergies  Past Medical History:  Diagnosis Date  . Calculus of kidney   . Chronic kidney disease    stage 3  . Edema   . Glaucoma   . Hyperlipidemia   . Hypertension   . Lumbar spondylosis   . Osteopenia   . Palpitations   . Postural kyphosis of lumbar region   . Pre-diabetes   . Rosacea   . Vitamin D deficiency     Blood pressure 140/60, pulse 69, temperature 98 F (36.7 C), resp. rate 16, height 5\' 4"  (1.626 m), weight 203 lb (92.1 kg), SpO2 95 %.  Essential hypertension Blood pressure remains above goal during OV, but greatly improved since changing losartan to valsartan. Low extremity edema is better as well, and patient denies problems with current therapy. Will  continue all current medication without changes, and follow up at HTN as needed. Noted patient already scheduled to see cardiologist in July. She will work on decreasing dietary sodium prior to follow up visit.    Tilly Pernice Rodriguez-Guzman PharmD, BCPS, [place] [address] 07/18/2019 2:40 PM

## 2019-07-14 NOTE — Patient Instructions (Signed)
Return for a  follow up appointment in [place] your blood pressure at home daily (if able) and keep record of the readings.  Take your BP meds as follows: *NO MEDICATION CHANGES*  Bring all of your meds, your BP cuff and your record of home blood pressures to your next appointment.  Exercise as you're able, try to walk approximately 30 minutes per day.  Keep salt intake to a minimum, especially watch canned and prepared boxed foods.  Eat more fresh fruits and vegetables and fewer canned items.  Avoid eating in fast food restaurants.    HOW TO TAKE YOUR BLOOD PRESSURE: . Rest 5 minutes before taking your blood pressure. .  Don't smoke or drink caffeinated beverages for at least 30 minutes before. . Take your blood pressure before (not after) you eat. . Sit comfortably with your back supported and both feet on the floor (don't cross your legs). . Elevate your arm to heart level on a table or a desk. . Use the proper sized cuff. It should fit smoothly and snugly around your bare upper arm. There should be enough room to slip a fingertip under the cuff. The bottom edge of the cuff should be 1 inch above the crease of the elbow. . Ideally, take 3 measurements at one sitting and record the average.

## 2019-07-18 ENCOUNTER — Encounter: Payer: Self-pay | Admitting: Pharmacist

## 2019-07-18 NOTE — Assessment & Plan Note (Signed)
Blood pressure remains above goal during OV, but greatly improved since changing losartan to valsartan. Low extremity edema is better as well, and patient denies problems with current therapy. Will continue all current medication without changes, and follow up at HTN as needed. Noted patient already scheduled to see cardiologist in July. She will work on decreasing dietary sodium prior to follow up visit.

## 2019-07-27 ENCOUNTER — Encounter: Payer: Self-pay | Admitting: Surgery

## 2019-07-27 ENCOUNTER — Ambulatory Visit (INDEPENDENT_AMBULATORY_CARE_PROVIDER_SITE_OTHER): Payer: Medicare Other | Admitting: Surgery

## 2019-07-27 ENCOUNTER — Other Ambulatory Visit: Payer: Self-pay

## 2019-07-27 ENCOUNTER — Ambulatory Visit
Admission: RE | Admit: 2019-07-27 | Discharge: 2019-07-27 | Disposition: A | Discharge status: Home / Self Care | Payer: Medicare Other | Source: Ambulatory Visit | Attending: Surgery | Admitting: Surgery

## 2019-07-27 VITALS — BP 152/72 | HR 58 | Temp 97.9°F | Resp 16 | Ht 64.0 in | Wt 203.0 lb

## 2019-07-27 DIAGNOSIS — I712 Thoracic aortic aneurysm, without rupture, unspecified: Secondary | ICD-10-CM

## 2019-07-27 DIAGNOSIS — R911 Solitary pulmonary nodule: Secondary | ICD-10-CM

## 2019-07-27 MED ORDER — IOPAMIDOL (ISOVUE-370) INJECTION 76%
75.0000 mL | Freq: Once | INTRAVENOUS | Status: AC | PRN
  Administered 2019-07-27: 75 mL via INTRAVENOUS

## 2019-07-27 NOTE — Progress Notes (Signed)
HPI:  The patient is an 86 year old woman who returns today for follow-up of a 5.1 cm fusiform ascending aortic aneurysm and small right upper lobe lung nodule.  She has been feeling well overall.  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  Current Outpatient Medications  Medication Sig Dispense Refill  . amLODipine (NORVASC) 5 MG tablet Take 1 tablet (5 mg total) by mouth daily. 90 tablet 3  . aspirin 81 MG tablet Take 81 mg by mouth daily.    [name] atorvastatin (LIPITOR) 10 MG tablet Take 10 mg by mouth daily.    . Biotin 1000 MCG tablet Take 1,000 mcg by mouth daily.    . calcium citrate-vitamin D (CITRACAL+D) 315-200 MG-UNIT tablet Take 1 tablet by mouth 2 (two) times daily.    . Cholecalciferol (VITAMIN D-3) 1000 units CAPS Take 1 capsule by mouth daily.    . cloNIDine (CATAPRES) 0.2 MG tablet Take 1 tablet (0.2 mg total) by mouth daily. 90 tablet 3  . furosemide (LASIX) 40 MG tablet Take 40 mg by mouth daily as needed.     . metoprolol succinate (TOPROL-XL) 100 MG 24 hr tablet Take 100 mg by mouth daily. Take with or immediately following a meal.    . Multiple Vitamins-Minerals (MULTIVITAMIN WITH MINERALS) tablet Take 1 tablet by mouth daily.    . valsartan (DIOVAN) 320 MG tablet Take 1 tablet by mouth daily in the mornings 30 tablet 3   No current facility-administered medications for this visit.     Physical Exam: BP (!) 152/72 (BP Location: Left Arm, Patient Position: Sitting, Cuff Size: Normal)   Pulse (!) 58   Temp 97.9 F (36.6 C)   Resp 16   Ht 5\' 4"  (1.626 m)   Wt 203 lb (92.1 kg)   SpO2 96% Comment: RA  BMI 34.84 kg/m  She looks well. Cardiac exam shows a regular rate and rhythm with normal heart sounds.  There is no murmur. Lungs are clear.  Diagnostic Tests:  CLINICAL DATA:  F/u TAA No surgery to chest No hx of cancer Nonsmoker F/u RUL nodule  EXAM: CT ANGIOGRAPHY CHEST WITH CONTRAST  TECHNIQUE: Multidetector CT imaging of the chest was performed  using the standard protocol during bolus administration of intravenous contrast. Multiplanar CT image reconstructions and MIPs were obtained to evaluate the vascular anatomy.  CONTRAST:  38mL ISOVUE-370 IOPAMIDOL (ISOVUE-370) INJECTION 76%  COMPARISON:  CT chest 01/19/2019  FINDINGS: Cardiovascular: Stable aneurysmal dilation of the ascending thoracic aorta measuring 5.1 cm in diameter. Stable aneurysmal dilation of the descending thoracic aorta measuring 3.2 cm. Aortic atherosclerosis. No pericardial effusion. The great vessels are patent. Coronary artery calcification. Normal cardiac size.  Mediastinum/Nodes: No enlarged mediastinal, hilar, or axillary lymph nodes. Thyroid gland and trachea demonstrate no significant findings. Small hiatal hernia.  Lungs/Pleura: The central airways are patent. Stable 7 mm nodule in the peripheral right upper lobe (series 7, image 60), unchanged dating back to March 2019. There is a new small area of tree-in-bud opacity in the right middle lobe (series 7, image 92) which may be infectious or inflammatory. The largest nodule in this grouping measures 1.1 cm. There are 2 small stable nodules at the left lung base measuring 0.5 cm and 0.6 cm (series 7, image 117 and 118), unchanged dating back to June 2020. No pneumothorax or pleural effusion.  Upper Abdomen: No acute abnormality. Stable hypervascular area along the anterior left hepatic lobe, likely a small vascular malformation.  Musculoskeletal: No  chest wall abnormality. Multilevel degenerative disc disease in the thoracic spine.  Review of the MIP images confirms the above findings.  IMPRESSION: 1. Stable aneurysmal dilation of the ascending thoracic aorta measuring 5.1 cm in diameter. 2. Stable aneurysmal dilation of the descending thoracic aorta measuring 3.2 cm. 3. New small area of tree-in-bud opacity in the right middle lobe, which may be infectious or inflammatory. The  largest nodule in this grouping measures 1.1 cm. Recommend follow-up in 3-6 months. 4. Stable right upper lung pulmonary nodule measuring 0.7 cm. Given stability dating back for over 2 years this is considered a benign process. Stable left lung base nodules, unchanged since June 2020, attention on follow-up. 5. Aortic atherosclerosis.  Aortic Atherosclerosis (ICD10-I70.0).   Electronically Signed   By: Bridget Evans M.D.   On: 07/27/2019 14:49   ECHOCARDIOGRAM REPORT       Patient Name:  Bridget Evans Date of Exam: 06/28/2019  Medical Rec #: [id number]    Height:    64.0 in  Accession #:  [id number]   Weight:    204.0 lb  Date of Birth: 08/23/1931    BSA:     1.973 m  Patient Age:  42 years    BP:      170/66 mmHg  Patient Gender: F        HR:      55 bpm.  Exam Location: [facility]   Procedure: 2D Echo, 3D Echo, Cardiac Doppler and Color Doppler   Indications:  R60.1 Edema    History:    Patient has prior history of Echocardiogram examinations,  most         recent 02/12/2017. Risk Factors:Hypertension, Family History  of         Coronary Artery Disease and Dyslipidemia. Palpitations,  Edema,         Chronic Kidney Disease.    Sonographer:  Bridget Evans  Referring Phys: [place]    1. Left ventricular ejection fraction, by estimation, is 60 to 65%. The  left ventricle has normal function. The left ventricle has no regional  wall motion abnormalities. Left ventricular diastolic parameters are  consistent with Grade I diastolic  dysfunction (impaired relaxation). Elevated left ventricular end-diastolic  pressure.  2. Right ventricular systolic function is normal. The right ventricular  size is normal. There is normal pulmonary artery systolic pressure.  3. Right atrial size was moderately dilated.  4. The mitral valve is normal  in structure. Trivial mitral valve  regurgitation. No evidence of mitral stenosis.  5. The aortic valve is normal in structure. Aortic valve regurgitation is  mild. No aortic stenosis is present. Aortic regurgitation PHT measures 544  msec.  6. Severe ascending aortic aneurysm unchanged from chest CT 01/2019.  Aortic dilatation noted. Aneurysm of the ascending aorta, measuring 51 mm.  There is mild dilatation at the level of the sinuses of Valsalva measuring  38 mm.  7. The inferior vena cava is normal in size with greater than 50%  respiratory variability, suggesting right atrial pressure of 3 mmHg.   FINDINGS  Left Ventricle: Left ventricular ejection fraction, by estimation, is 60  to 65%. The left ventricle has normal function. The left ventricle has no  regional wall motion abnormalities. The left ventricular internal cavity  size was normal in size. There is  no left ventricular hypertrophy. Left ventricular diastolic parameters  are consistent with Grade I diastolic dysfunction (impaired relaxation).  Elevated left  ventricular end-diastolic pressure.   Right Ventricle: The right ventricular size is normal. No increase in  right ventricular wall thickness. Right ventricular systolic function is  normal. There is normal pulmonary artery systolic pressure. The tricuspid  regurgitant velocity is 1.60 m/s, and  with an assumed right atrial pressure of 3 mmHg, the estimated right  ventricular systolic pressure is 48.5 mmHg.   Left Atrium: Left atrial size was normal in size.   Right Atrium: Right atrial size was moderately dilated.   Pericardium: There is no evidence of pericardial effusion.   Mitral Valve: The mitral valve is normal in structure. Normal mobility of  the mitral valve leaflets. Trivial mitral valve regurgitation. No evidence  of mitral valve stenosis.   Tricuspid Valve: The tricuspid valve is normal in structure. Tricuspid  valve regurgitation is trivial.  No evidence of tricuspid stenosis.   Aortic Valve: The aortic valve is normal in structure.. There is mild  thickening and mild calcification of the aortic valve. Aortic valve  regurgitation is mild. Aortic regurgitation PHT measures 544 msec. No  aortic stenosis is present. There is mild  thickening of the aortic valve. There is mild calcification of the aortic  valve.   Pulmonic Valve: The pulmonic valve was normal in structure. Pulmonic valve  regurgitation is not visualized. No evidence of pulmonic stenosis.   Aorta: Severe ascending aortic aneurysm unchanged from chest CT 01/2019.  Aortic dilatation noted. There is mild dilatation at the level of the  sinuses of Valsalva measuring 38 mm. There is an aneurysm involving the  ascending aorta. The aneurysm measures  51 mm.   Venous: The inferior vena cava is normal in size with greater than 50%  respiratory variability, suggesting right atrial pressure of 3 mmHg.   IAS/Shunts: No atrial level shunt detected by color flow Doppler.     LEFT VENTRICLE  PLAX 2D  LVIDd:     4.94 cm Diastology  LVIDs:     2.82 cm LV e' lateral:  8.70 cm/s  LV PW:     1.17 cm LV E/e' lateral: 10.9  LV IVS:    0.66 cm LV e' medial:  4.79 cm/s  LVOT diam:   2.35 cm LV E/e' medial: 19.9  LV SV:     156  LV SV Index:  79  LVOT Area:   4.34 cm               3D Volume EF:             3D EF:    46 %             LV EDV:    150 ml             LV ESV:    80 ml             LV SV:    69 ml   LEFT ATRIUM      Index    RIGHT ATRIUM      Index  LA diam:   4.00 cm 2.03 cm/m RA Area:   22.60 cm  LA Vol (A4C): 52.7 ml 26.71 ml/m RA Volume:  67.10 ml 34.01 ml/m  AORTIC VALVE  LVOT Vmax:  122.00 cm/s  LVOT Vmean: 79.600 cm/s  LVOT VTI:  0.359 m  AI PHT:   544 msec    AORTA  Ao Root diam: 3.75 cm  Ao Asc diam:  5.10 cm   MITRAL VALVE  TRICUSPID VALVE  MV Area (PHT) cm     TR Peak grad:  10.2 mmHg  MV Decel Time: 220 msec   TR Vmax:    160.00 cm/s  MV E velocity: 95.23 cm/s  MV A velocity: 106.67 cm/s SHUNTS  MV E/A ratio: 0.89     Systemic VTI: 0.36 m               Systemic Diam: 2.35 cm   Bridget Evans  Electronically signed by Bridget Evans  Signature Date/Time: 06/28/2019/6:01:44 PM     Impression:  She has a stable 5.1 cm fusiform ascending aortic aneurysm which has been stable dating back to CT scan done on 05/05/2017.  This is still below the surgical threshold of 5.5 cm and given her advanced age I recommended continued follow-up.  A recent echocardiogram shows mild aortic insufficiency with normal left ventricular systolic function.  I reviewed the CT and echo findings with her and answered her questions.  I stressed the importance of continued good blood pressure control and preventing further enlargement and acute aortic dissection.  She has been following her blood pressure at home and has a chart with her today.  Most of her blood pressures are in the 120-130's with an occasional systolic blood pressure of 150.  She also has a new small area of tree-in-bud opacity in the right middle lobe which may be infectious or inflammatory and was not present on the CT scan in December 2020.  This will require follow-up in about 6 months.  She has a stable 7 mm nodule in the peripheral aspect of the right upper lobe which is unchanged dating back to March 5183 and certainly benign.  Plan:  I will see her back in 6 months with a CT scan of the chest without contrast to follow-up on the new right middle lobe lung nodules.  She will have a CTA of the chest in 1 year to follow-up on the ascending aortic aneurysm.  I spent 20 minutes performing this established patient evaluation and > 50% of this time was spent face to face counseling and  coordinating the care of this patient's aortic aneurysm and pulmonary nodules.    Bridget Pollack, [facility] [phone]

## 2019-07-28 ENCOUNTER — Ambulatory Visit: Payer: Medicare Other | Admitting: Cardiothoracic Surgery

## 2019-09-03 NOTE — Progress Notes (Signed)
HPI: FUTAA. Stress echocardiogram2/17normal. Seen for CP 12/18. Echo 1/19 showed normal LV function, grade 1 DD, mild AI, moderately dilated aortic root, mild MR and TR. CTA 3/19 showedfusiform aneurysmal dilatation of ascending aorta measuring 5.1 cm. Seen by Dr Cyndia Bent and FU CTs recommended. Echo 5/21 showed normal LV function; grade 1 DD; moderate RAE; mild AI; TAA. Last chest CT 6/21 showed 5.1 cm thoracic aortic aneurysm and RML nodule (fu recommended 6 months).Since last seen,she is grieving as she lost her husband 2 weeks ago.  She feels as though her blood pressure is elevated due to this.  She denies dyspnea, chest pain, palpitations or syncope.  Minimal pedal edema.  Current Outpatient Medications  Medication Sig Dispense Refill  . amLODipine (NORVASC) 5 MG tablet Take 1 tablet (5 mg total) by mouth daily. 90 tablet 3  . aspirin 81 MG tablet Take 81 mg by mouth daily.    [name] atorvastatin (LIPITOR) 10 MG tablet Take 10 mg by mouth daily.    . Biotin 1000 MCG tablet Take 1,000 mcg by mouth daily.    . calcium citrate-vitamin D (CITRACAL+D) 315-200 MG-UNIT tablet Take 1 tablet by mouth 2 (two) times daily.    . Cholecalciferol (VITAMIN D-3) 1000 units CAPS Take 1 capsule by mouth daily.    . cloNIDine (CATAPRES) 0.2 MG tablet Take 1 tablet (0.2 mg total) by mouth daily. 90 tablet 3  . furosemide (LASIX) 40 MG tablet Take 40 mg by mouth daily as needed.     . metoprolol succinate (TOPROL-XL) 100 MG 24 hr tablet Take 100 mg by mouth daily. Take with or immediately following a meal.    . Multiple Vitamins-Minerals (MULTIVITAMIN WITH MINERALS) tablet Take 1 tablet by mouth daily.    . valsartan (DIOVAN) 320 MG tablet Take 1 tablet by mouth daily in the mornings 30 tablet 3   No current facility-administered medications for this visit.     Past Medical History:  Diagnosis Date  . Calculus of kidney   . Chronic kidney disease    stage 3  . Edema   . Glaucoma   .  Hyperlipidemia   . Hypertension   . Lumbar spondylosis   . Osteopenia   . Palpitations   . Postural kyphosis of lumbar region   . Pre-diabetes   . Rosacea   . Vitamin D deficiency     Past Surgical History:  Procedure Laterality Date  . ABDOMINAL HYSTERECTOMY    . COLON SURGERY  2008   mass removed sigmoid    Social History   Socioeconomic History  . Marital status: Married    Spouse name: Not on file  . Number of children: 2  . Years of education: Not on file  . Highest education level: Not on file  Occupational History  . Not on file  Tobacco Use  . Smoking status: Never Smoker  . Smokeless tobacco: Never Used  Vaping Use  . Vaping Use: Never used  Substance and Sexual Activity  . Alcohol use: No    Alcohol/week: 0.0 standard drinks  . Drug use: Never  . Sexual activity: Not on file  Other Topics Concern  . Not on file  Social History Narrative  . Not on file   Social Determinants of Health   Financial Resource Strain:   . Difficulty of Paying Living Expenses:   Food Insecurity:   . Worried About Charity fundraiser in the Last Year:   . Ran  Out of Food in the Last Year:   Transportation Needs:   . Lack of Transportation (Medical):   [name] Lack of Transportation (Non-Medical):   Physical Activity:   . Days of Exercise per Week:   . Minutes of Exercise per Session:   Stress:   . Feeling of Stress :   Social Connections:   . Frequency of Communication with Friends and Family:   . Frequency of Social Gatherings with Friends and Family:   . Attends Religious Services:   . Active Member of Clubs or Organizations:   . Attends Archivist Meetings:   [name] Marital Status:   Intimate Partner Violence:   . Fear of Current or Ex-Partner:   . Emotionally Abused:   [name] Physically Abused:   . Sexually Abused:     Family History  Problem Relation Age of Onset  . CVA Mother   . Cancer Father   . Cancer Sister   . Heart attack Brother   . Colon cancer  Brother   . Breast cancer Sister     ROS: no fevers or chills, productive cough, hemoptysis, dysphasia, odynophagia, melena, hematochezia, dysuria, hematuria, rash, seizure activity, orthopnea, PND, pedal edema, claudication. Remaining systems are negative.  Physical Exam: Well-developed well-nourished in no acute distress.  Skin is warm and dry.  HEENT is normal.  Neck is supple.  Chest is clear to auscultation with normal expansion.  Cardiovascular exam is irregular; 1/6 diastolic murmur left sternal border. Abdominal exam nontender or distended. No masses palpated. Extremities show no edema. neuro grossly intact  ECG-sinus rhythm with PACs, left ventricular hypertrophy, cannot rule out prior inferior infarct.  Personally reviewed  A/P  1 TAA-unchanged on recent CTA; plan fu study 6/22; also followed by Dr Cyndia Bent.  2 Lung nodule-FU CT 12/21 per Dr Cyndia Bent.  3 hypertension-BP elevated; increase clonidine to 0.2 mg in the morning and 0.1 mg in the evening.  Follow blood pressure and adjust regimen as needed.  4 hyperlipidemia-continue statin.  5 lower ext edema-will continue present dose of lasix.  Kirk Ruths, MD

## 2019-09-07 ENCOUNTER — Ambulatory Visit (INDEPENDENT_AMBULATORY_CARE_PROVIDER_SITE_OTHER): Payer: Medicare Other | Admitting: Cardiology

## 2019-09-07 ENCOUNTER — Other Ambulatory Visit: Payer: Self-pay

## 2019-09-07 ENCOUNTER — Encounter: Payer: Self-pay | Admitting: Cardiology

## 2019-09-07 VITALS — BP 164/76 | HR 83 | Ht 64.0 in | Wt 200.8 lb

## 2019-09-07 DIAGNOSIS — I712 Thoracic aortic aneurysm, without rupture, unspecified: Secondary | ICD-10-CM

## 2019-09-07 DIAGNOSIS — E7800 Pure hypercholesterolemia, unspecified: Secondary | ICD-10-CM | POA: Diagnosis not present

## 2019-09-07 DIAGNOSIS — I10 Essential (primary) hypertension: Secondary | ICD-10-CM

## 2019-09-07 MED ORDER — CLONIDINE HCL 0.2 MG PO TABS: 0.2000 mg | ORAL_TABLET | Freq: Every morning | ORAL | 3 refills | Status: DC

## 2019-09-07 MED ORDER — CLONIDINE HCL 0.1 MG PO TABS: 0.1000 mg | ORAL_TABLET | Freq: Every day | ORAL | 3 refills | Status: DC

## 2019-09-07 NOTE — Patient Instructions (Signed)
Medication Instructions:  Add 0.1mg  clonidine tablet once daily in the evening *If you need a refill on your cardiac medications before your next appointment, please call your pharmacy*   Lab Work: None Ordered If you have labs (blood work) drawn today and your tests are completely normal, you will receive your results only by: [name] MyChart Message (if you have MyChart) OR . A paper copy in the mail If you have any lab test that is abnormal or we need to change your treatment, we will call you to review the results.   Testing/Procedures: None Ordered   Follow-Up: At [facility], you and your health needs are our priority.  As part of our continuing mission to provide you with exceptional heart care, we have created designated Provider Care Teams.  These Care Teams include your primary Cardiologist (physician) and Advanced Practice Providers (APPs -  Physician Assistants and Nurse Practitioners) who all work together to provide you with the care you need, when you need it.  We recommend signing up for the patient portal called "MyChart".  Sign up information is provided on this After Visit Summary.  MyChart is used to connect with patients for Virtual Visits (Telemedicine).  Patients are able to view lab/test results, encounter notes, upcoming appointments, etc.  Non-urgent messages can be sent to your provider as well.   To learn more about what you can do with MyChart, go to [email or web address].    Your next appointment:   6 month(s)  The format for your next appointment:   In Person  Provider:   Kirk Ruths, MD   Other Instructions

## 2019-09-14 DIAGNOSIS — Z1331 Encounter for screening for depression: Secondary | ICD-10-CM | POA: Diagnosis not present

## 2019-09-14 DIAGNOSIS — E7800 Pure hypercholesterolemia, unspecified: Secondary | ICD-10-CM | POA: Diagnosis not present

## 2019-09-14 DIAGNOSIS — R7303 Prediabetes: Secondary | ICD-10-CM | POA: Diagnosis not present

## 2019-09-14 DIAGNOSIS — R609 Edema, unspecified: Secondary | ICD-10-CM | POA: Diagnosis not present

## 2019-09-14 DIAGNOSIS — I712 Thoracic aortic aneurysm, without rupture: Secondary | ICD-10-CM | POA: Diagnosis not present

## 2019-09-14 DIAGNOSIS — N183 Chronic kidney disease, stage 3 unspecified: Secondary | ICD-10-CM | POA: Diagnosis not present

## 2019-09-14 DIAGNOSIS — G250 Essential tremor: Secondary | ICD-10-CM | POA: Diagnosis not present

## 2019-09-14 DIAGNOSIS — Z9181 History of falling: Secondary | ICD-10-CM | POA: Diagnosis not present

## 2019-09-14 DIAGNOSIS — I10 Essential (primary) hypertension: Secondary | ICD-10-CM | POA: Diagnosis not present

## 2019-10-19 ENCOUNTER — Other Ambulatory Visit: Payer: Self-pay | Admitting: Cardiology

## 2019-11-30 DIAGNOSIS — Z23 Encounter for immunization: Secondary | ICD-10-CM | POA: Diagnosis not present

## 2020-01-10 ENCOUNTER — Other Ambulatory Visit: Payer: Self-pay | Admitting: Surgery

## 2020-01-10 DIAGNOSIS — I712 Thoracic aortic aneurysm, without rupture, unspecified: Secondary | ICD-10-CM

## 2020-01-10 DIAGNOSIS — R911 Solitary pulmonary nodule: Secondary | ICD-10-CM

## 2020-01-25 ENCOUNTER — Ambulatory Visit: Payer: Medicare Other | Admitting: Surgery

## 2020-01-25 DIAGNOSIS — Z23 Encounter for immunization: Secondary | ICD-10-CM | POA: Diagnosis not present

## 2020-02-01 ENCOUNTER — Ambulatory Visit
Admission: RE | Admit: 2020-02-01 | Discharge: 2020-02-01 | Disposition: A | Discharge status: Home / Self Care | Payer: Medicare Other | Source: Ambulatory Visit | Attending: Surgery | Admitting: Surgery

## 2020-02-01 ENCOUNTER — Encounter: Payer: Self-pay | Admitting: Surgery

## 2020-02-01 ENCOUNTER — Ambulatory Visit (INDEPENDENT_AMBULATORY_CARE_PROVIDER_SITE_OTHER): Payer: Medicare Other | Admitting: Surgery

## 2020-02-01 ENCOUNTER — Other Ambulatory Visit: Payer: Self-pay

## 2020-02-01 VITALS — BP 193/78 | HR 78 | Resp 20 | Ht 64.0 in | Wt 196.0 lb

## 2020-02-01 DIAGNOSIS — I712 Thoracic aortic aneurysm, without rupture, unspecified: Secondary | ICD-10-CM

## 2020-02-01 DIAGNOSIS — J984 Other disorders of lung: Secondary | ICD-10-CM | POA: Diagnosis not present

## 2020-02-01 DIAGNOSIS — J479 Bronchiectasis, uncomplicated: Secondary | ICD-10-CM | POA: Diagnosis not present

## 2020-02-01 DIAGNOSIS — I2510 Atherosclerotic heart disease of native coronary artery without angina pectoris: Secondary | ICD-10-CM | POA: Diagnosis not present

## 2020-02-01 DIAGNOSIS — R911 Solitary pulmonary nodule: Secondary | ICD-10-CM

## 2020-02-01 MED ORDER — CLONIDINE HCL 0.1 MG PO TABS: 0.1000 mg | ORAL_TABLET | Freq: Every day | ORAL | 3 refills | Status: AC

## 2020-02-01 NOTE — Progress Notes (Signed)
HPI:  The patient is an 86 year old woman who returns today for follow-up of a 5.1 cm fusiform ascending aortic aneurysm and small right upper lobe lung nodule.  She has been feeling well overall.  She does note that she began having a lot of muscle cramps particularly at night and therefore stopped taking Lipitor.  This improved her cramps but she still has some.  She denies any chest or back pain.  She has been checking her blood pressure frequently at home and is usually in the 120-130 range.  Occasional values are in the 150-160 range and when this occurs she takes an extra clonidine 0.1 mg.  Current Outpatient Medications  Medication Sig Dispense Refill   aspirin 81 MG tablet Take 81 mg by mouth daily.     Biotin 1000 MCG tablet Take 1,000 mcg by mouth daily.     calcium citrate-vitamin D (CITRACAL+D) 315-200 MG-UNIT tablet Take 1 tablet by mouth 2 (two) times daily.     Cholecalciferol (VITAMIN D-3) 1000 units CAPS Take 1 capsule by mouth daily.     cloNIDine (CATAPRES) 0.1 MG tablet Take 1 tablet (0.1 mg total) by mouth at bedtime. 90 tablet 3   cloNIDine (CATAPRES) 0.2 MG tablet Take 1 tablet (0.2 mg total) by mouth every morning. 90 tablet 3   furosemide (LASIX) 40 MG tablet Take 40 mg by mouth daily as needed.      metoprolol succinate (TOPROL-XL) 100 MG 24 hr tablet Take 100 mg by mouth daily. Take with or immediately following a meal.     Multiple Vitamins-Minerals (MULTIVITAMIN WITH MINERALS) tablet Take 1 tablet by mouth daily.     valsartan (DIOVAN) 320 MG tablet TAKE 1 TABLET BY MOUTH ONCE DAILY IN THE MORNING 30 tablet 10   amLODipine (NORVASC) 5 MG tablet Take 1 tablet (5 mg total) by mouth daily. 90 tablet 3   atorvastatin (LIPITOR) 10 MG tablet Take 10 mg by mouth daily. (Patient not taking: Reported on 02/01/2020)     No current facility-administered medications for this visit.     Physical Exam: BP (!) 193/78 (BP Location: Right Arm, Patient Position:  Sitting)    Pulse 78    Resp 20    Ht 5\' 4"  (1.626 m)    Wt 196 lb (88.9 kg)    SpO2 98% Comment: RA with mask on   BMI 33.64 kg/m  She looks well. Cardiac exam shows a regular rate and rhythm with normal heart sounds.  There is no murmur. Lungs are clear. There is no peripheral edema.  Diagnostic Tests:  CLINICAL DATA:  Follow-up pulmonary nodule and aortic aneurysm  EXAM: CT CHEST WITHOUT CONTRAST  TECHNIQUE: Multidetector CT imaging of the chest was performed following the standard protocol without IV contrast.  COMPARISON:  07/27/2019  FINDINGS: Cardiovascular: Stable borderline heart size. No pericardial effusion.  Atheromatous aorta with aneurysmal ascending segment that measures up to 5.2 cm in diameter, unchanged. 3.5 cm diameter descending aortic aneurysm which is unchanged when remeasured in a similar fashion.  Coronary calcifications which are multifocal. No acute vascular finding without contrast. No intimal calcium displacement or intramural hematoma seen.  Mediastinum/Nodes: Negative for adenopathy or mass.  Lungs/Pleura: Improved aeration in the lateral segment right middle lobe where there is wispy density attributed to scarring and mild bronchiectasis. A 7 mm average diameter pulmonary nodule in the right upper lobe is stable from 05/05/2017, and considered benign.  Upper Abdomen: Negative  Musculoskeletal: No acute finding.  Advanced spinal degeneration with scoliosis and kyphosis.  IMPRESSION: 1. Unchanged fusiform aortic aneurysms of the ascending and descending segments, up to 5.2 cm diameter at the ascending segment. 2. 7 mm average diameter right upper lobe pulmonary nodule remains stable and is considered benign. 3. Regressed nodularity in the lateral segment right middle lobe where residual opacity is likely scarring. Mild cylindrical bronchiectasis in this area.  Aortic Atherosclerosis (ICD10-I70.0).   Electronically  Signed   By: Monte Fantasia M.D.   On: 02/01/2020 11:22  Impression:  This 86 year old woman has a stable 5.1 to 5.2 cm fusiform ascending aortic aneurysm.  Her descending thoracic aorta at the same level is 3.0 cm.  This has been stable since March 2019.  It is still below the surgical threshold of 5.5 cm in this patient with a normal trileaflet aortic valve.  Her blood pressure is elevated today but is usually under good control at home as noted in her blood pressure log that she brought with her today.  I stressed the importance of continued good blood pressure control and preventing further enlargement and acute aortic dissection.  She would like me to renew her prescription for as needed clonidine 0.1 mg which she takes if her systolic blood pressure is 150 or greater.  I will renew that for her today.  She will stay on her other medications.  I reviewed the CTA images with her and her daughter and answered their questions.  Plan:  She will return to see me in 6 months with a CT scan of the chest without contrast.  I spent 20 minutes performing this established patient evaluation and > 50% of this time was spent face to face counseling and coordinating the care of this patient's aortic aneurysm.    Gaye Pollack, MD [facility] [phone]

## 2020-03-02 ENCOUNTER — Other Ambulatory Visit: Payer: Self-pay | Admitting: Cardiology

## 2020-03-02 DIAGNOSIS — I10 Essential (primary) hypertension: Secondary | ICD-10-CM

## 2020-03-14 NOTE — Progress Notes (Signed)
HPI: FUTAA. Stress echocardiogram2/17normal. Seen for CP 12/18. Echo 1/19 showed normal LV function, grade 1 DD, mild AI, moderately dilated aortic root, mild MR and TR. CTA 3/19 showedfusiform aneurysmal dilatation of ascending aorta measuring 5.1 cm. Seen by Dr Cyndia Bent and FU CTs recommended.Echo 5/21 showed normal LV function; grade 1 DD; moderate RAE; mild AI; TAA. Chest CT December 2021 showed 5.2 cm a sending thoracic aortic aneurysm and stable right upper lobe nodule.  Since last seen,there is no dyspnea, chest pain, palpitations or syncope.  Current Outpatient Medications  Medication Sig Dispense Refill  . amLODipine (NORVASC) 10 MG tablet Take 1 tablet by mouth once daily 90 tablet 0  . amLODipine (NORVASC) 5 MG tablet Take 1 tablet (5 mg total) by mouth daily. 90 tablet 3  . aspirin 81 MG tablet Take 81 mg by mouth daily.    [name] atorvastatin (LIPITOR) 10 MG tablet Take 10 mg by mouth daily.    . Biotin 1000 MCG tablet Take 1,000 mcg by mouth daily.    . calcium citrate-vitamin D (CITRACAL+D) 315-200 MG-UNIT tablet Take 1 tablet by mouth 2 (two) times daily.    . Cholecalciferol (VITAMIN D-3) 1000 units CAPS Take 1 capsule by mouth daily.    . cloNIDine (CATAPRES) 0.1 MG tablet Take 1 tablet (0.1 mg total) by mouth at bedtime. 90 tablet 3  . cloNIDine (CATAPRES) 0.2 MG tablet Take 1 tablet (0.2 mg total) by mouth every morning. 90 tablet 3  . furosemide (LASIX) 40 MG tablet Take 40 mg by mouth daily as needed.     . metoprolol succinate (TOPROL-XL) 100 MG 24 hr tablet Take 100 mg by mouth daily. Take with or immediately following a meal.    . Multiple Vitamins-Minerals (MULTIVITAMIN WITH MINERALS) tablet Take 1 tablet by mouth daily.    . valsartan (DIOVAN) 320 MG tablet TAKE 1 TABLET BY MOUTH ONCE DAILY IN THE MORNING 30 tablet 10   No current facility-administered medications for this visit.     Past Medical History:  Diagnosis Date  . Calculus of kidney   . Chronic  kidney disease    stage 3  . Edema   . Glaucoma   . Hyperlipidemia   . Hypertension   . Lumbar spondylosis   . Osteopenia   . Palpitations   . Postural kyphosis of lumbar region   . Pre-diabetes   . Rosacea   . Vitamin D deficiency     Past Surgical History:  Procedure Laterality Date  . ABDOMINAL HYSTERECTOMY    . COLON SURGERY  2008   mass removed sigmoid    Social History   Socioeconomic History  . Marital status: Married    Spouse name: Not on file  . Number of children: 2  . Years of education: Not on file  . Highest education level: Not on file  Occupational History  . Not on file  Tobacco Use  . Smoking status: Never Smoker  . Smokeless tobacco: Never Used  Vaping Use  . Vaping Use: Never used  Substance and Sexual Activity  . Alcohol use: No    Alcohol/week: 0.0 standard drinks  . Drug use: Never  . Sexual activity: Not on file  Other Topics Concern  . Not on file  Social History Narrative  . Not on file   Social Determinants of Health   Financial Resource Strain: Not on file  Food Insecurity: Not on file  Transportation Needs: Not on file  Physical Activity: Not on file  Stress: Not on file  Social Connections: Not on file  Intimate Partner Violence: Not on file    Family History  Problem Relation Age of Onset  . CVA Mother   . Cancer Father   . Cancer Sister   . Heart attack Brother   . Colon cancer Brother   . Breast cancer Sister     ROS: Back pain but no fevers or chills, productive cough, hemoptysis, dysphasia, odynophagia, melena, hematochezia, dysuria, hematuria, rash, seizure activity, orthopnea, PND, pedal edema, claudication. Remaining systems are negative.  Physical Exam: Well-developed well-nourished in no acute distress.  Skin is warm and dry.  HEENT is normal.  Neck is supple.  Chest is clear to auscultation with normal expansion.  Cardiovascular exam is regular rate and rhythm.  Abdominal exam nontender or distended.  No masses palpated. Extremities show no edema. neuro grossly intact  A/P  1 thoracic aortic aneurysm-patient will need follow-up CT June 2022; followed by Dr Cyndia Bent.  2 hypertension-patient's blood pressure has been running high.  She was given a prescription for spironolactone 25 mg daily by primary care.  She will initiate and we will check potassium and renal function in 1 week.  Follow blood pressure and adjust regimen as needed.  3 hyperlipidemia-continue statin.  Check lipids and liver.  4 lower extremity edema-continue Lasix at present dose.  Check potassium and renal function.  Kirk Ruths, MD

## 2020-03-21 DIAGNOSIS — Z6836 Body mass index (BMI) 36.0-36.9, adult: Secondary | ICD-10-CM | POA: Diagnosis not present

## 2020-03-21 DIAGNOSIS — R7303 Prediabetes: Secondary | ICD-10-CM | POA: Diagnosis not present

## 2020-03-21 DIAGNOSIS — N183 Chronic kidney disease, stage 3 unspecified: Secondary | ICD-10-CM | POA: Diagnosis not present

## 2020-03-21 DIAGNOSIS — E7800 Pure hypercholesterolemia, unspecified: Secondary | ICD-10-CM | POA: Diagnosis not present

## 2020-03-21 DIAGNOSIS — G250 Essential tremor: Secondary | ICD-10-CM | POA: Diagnosis not present

## 2020-03-21 DIAGNOSIS — M545 Low back pain, unspecified: Secondary | ICD-10-CM | POA: Diagnosis not present

## 2020-03-21 DIAGNOSIS — Z139 Encounter for screening, unspecified: Secondary | ICD-10-CM | POA: Diagnosis not present

## 2020-03-21 DIAGNOSIS — I10 Essential (primary) hypertension: Secondary | ICD-10-CM | POA: Diagnosis not present

## 2020-03-21 DIAGNOSIS — I712 Thoracic aortic aneurysm, without rupture: Secondary | ICD-10-CM | POA: Diagnosis not present

## 2020-03-21 DIAGNOSIS — R609 Edema, unspecified: Secondary | ICD-10-CM | POA: Diagnosis not present

## 2020-03-23 ENCOUNTER — Encounter: Payer: Self-pay | Admitting: Cardiology

## 2020-03-23 ENCOUNTER — Ambulatory Visit (INDEPENDENT_AMBULATORY_CARE_PROVIDER_SITE_OTHER): Payer: Medicare Other | Admitting: Cardiology

## 2020-03-23 ENCOUNTER — Other Ambulatory Visit: Payer: Self-pay

## 2020-03-23 VITALS — BP 142/66 | HR 59 | Ht 64.0 in | Wt 203.0 lb

## 2020-03-23 DIAGNOSIS — I712 Thoracic aortic aneurysm, without rupture, unspecified: Secondary | ICD-10-CM

## 2020-03-23 DIAGNOSIS — I10 Essential (primary) hypertension: Secondary | ICD-10-CM

## 2020-03-23 DIAGNOSIS — E7800 Pure hypercholesterolemia, unspecified: Secondary | ICD-10-CM

## 2020-03-23 MED ORDER — CLONIDINE HCL 0.2 MG PO TABS: 0.2000 mg | ORAL_TABLET | Freq: Every morning | ORAL | 3 refills | Status: AC

## 2020-03-23 MED ORDER — SPIRONOLACTONE 25 MG PO TABS
25.0000 mg | ORAL_TABLET | Freq: Every day | ORAL | 3 refills | Status: DC
Start: 2020-03-23 — End: 2020-10-19

## 2020-03-23 NOTE — Patient Instructions (Signed)
  Lab Work:  Your physician recommends that you return for lab work in: one week  If you have labs (blood work) drawn today and your tests are completely normal, you will receive your results only by: [name] MyChart Message (if you have MyChart) OR . A paper copy in the mail If you have any lab test that is abnormal or we need to change your treatment, we will call you to review the results.   Follow-Up: At [facility], you and your health needs are our priority.  As part of our continuing mission to provide you with exceptional heart care, we have created designated Provider Care Teams.  These Care Teams include your primary Cardiologist (physician) and Advanced Practice Providers (APPs -  Physician Assistants and Nurse Practitioners) who all work together to provide you with the care you need, when you need it.  We recommend signing up for the patient portal called "MyChart".  Sign up information is provided on this After Visit Summary.  MyChart is used to connect with patients for Virtual Visits (Telemedicine).  Patients are able to view lab/test results, encounter notes, upcoming appointments, etc.  Non-urgent messages can be sent to your provider as well.   To learn more about what you can do with MyChart, go to [email or web address].    Your next appointment:   6 month(s)  The format for your next appointment:   In Person  Provider:   Kirk Ruths, MD

## 2020-03-27 ENCOUNTER — Telehealth: Payer: Self-pay | Admitting: *Deleted

## 2020-03-27 DIAGNOSIS — E7800 Pure hypercholesterolemia, unspecified: Secondary | ICD-10-CM

## 2020-03-27 MED ORDER — ROSUVASTATIN CALCIUM 20 MG PO TABS: 20.0000 mg | ORAL_TABLET | Freq: Every day | ORAL | 3 refills | Status: AC

## 2020-03-27 NOTE — Telephone Encounter (Signed)
Spoke with pt, we had received a copy of her recent lab work from [facility] and her LDL is 161. She was not taking the lipitor at the time the lab work was drawn because she had been having problems tolerating it. She is willing to try crestor, discussed with dr Stanford Breed, new script for rosuvastatin 20 mg once daily sent into the pharmacy. Lab orders mailed to the pt for repeat lab work in 12 weeks.

## 2020-04-03 DIAGNOSIS — E7800 Pure hypercholesterolemia, unspecified: Secondary | ICD-10-CM | POA: Diagnosis not present

## 2020-04-03 DIAGNOSIS — I10 Essential (primary) hypertension: Secondary | ICD-10-CM | POA: Diagnosis not present

## 2020-04-04 ENCOUNTER — Encounter: Payer: Self-pay | Admitting: *Deleted

## 2020-04-04 LAB — LIPID PANEL
Chol/HDL Ratio: 3.3 ratio (ref 0.0–4.4)
Cholesterol, Total: 127 mg/dL (ref 100–199)
HDL: 39 mg/dL — ABNORMAL LOW (ref >39)
LDL Chol Calc (NIH): 61 mg/dL (ref 0–99)
Triglycerides: 159 mg/dL — ABNORMAL HIGH (ref 0–149)
VLDL Cholesterol Cal: 27 mg/dL (ref 5–40)

## 2020-04-04 LAB — HEPATIC FUNCTION PANEL
ALT: 18 IU/L (ref 0–32)
AST: 21 IU/L (ref 0–40)
Albumin: 4.3 g/dL (ref 3.6–4.6)
Alkaline Phosphatase: 55 IU/L (ref 44–121)
Bilirubin Total: 0.4 mg/dL (ref 0.0–1.2)
Bilirubin, Direct: 0.14 mg/dL (ref 0.00–0.40)
Total Protein: 6.4 g/dL (ref 6.0–8.5)

## 2020-04-04 LAB — BASIC METABOLIC PANEL
BUN/Creatinine Ratio: 25 (ref 12–28)
BUN: 20 mg/dL (ref 8–27)
CO2: 19 mmol/L — ABNORMAL LOW (ref 20–29)
Calcium: 9.7 mg/dL (ref 8.7–10.3)
Chloride: 103 mmol/L (ref 96–106)
Creatinine, Ser: 0.79 mg/dL (ref 0.57–1.00)
GFR calc Af Amer: 78 mL/min/{1.73_m2} (ref >59)
GFR calc non Af Amer: 68 mL/min/{1.73_m2} (ref >59)
Glucose: 98 mg/dL (ref 65–99)
Potassium: 4.3 mmol/L (ref 3.5–5.2)
Sodium: 140 mmol/L (ref 134–144)

## 2020-04-18 DIAGNOSIS — R609 Edema, unspecified: Secondary | ICD-10-CM | POA: Diagnosis not present

## 2020-04-18 DIAGNOSIS — Z6835 Body mass index (BMI) 35.0-35.9, adult: Secondary | ICD-10-CM | POA: Diagnosis not present

## 2020-04-18 DIAGNOSIS — I10 Essential (primary) hypertension: Secondary | ICD-10-CM | POA: Diagnosis not present

## 2020-04-18 DIAGNOSIS — Z79899 Other long term (current) drug therapy: Secondary | ICD-10-CM | POA: Diagnosis not present

## 2020-04-18 DIAGNOSIS — M545 Low back pain, unspecified: Secondary | ICD-10-CM | POA: Diagnosis not present

## 2020-06-29 ENCOUNTER — Other Ambulatory Visit: Payer: Self-pay | Admitting: *Deleted

## 2020-06-29 DIAGNOSIS — I712 Thoracic aortic aneurysm, without rupture, unspecified: Secondary | ICD-10-CM

## 2020-06-29 NOTE — Progress Notes (Unsigned)
Ct chest

## 2020-08-01 ENCOUNTER — Ambulatory Visit (INDEPENDENT_AMBULATORY_CARE_PROVIDER_SITE_OTHER): Payer: Medicare Other | Admitting: Surgery

## 2020-08-01 ENCOUNTER — Ambulatory Visit
Admission: RE | Admit: 2020-08-01 | Discharge: 2020-08-01 | Disposition: A | Discharge status: Home / Self Care | Payer: Medicare Other | Source: Ambulatory Visit | Attending: Surgery | Admitting: Surgery

## 2020-08-01 ENCOUNTER — Other Ambulatory Visit: Payer: Self-pay

## 2020-08-01 ENCOUNTER — Encounter: Payer: Self-pay | Admitting: Surgery

## 2020-08-01 VITALS — BP 154/74 | HR 77 | Resp 20 | Ht 64.0 in | Wt 200.0 lb

## 2020-08-01 DIAGNOSIS — I712 Thoracic aortic aneurysm, without rupture, unspecified: Secondary | ICD-10-CM

## 2020-08-01 DIAGNOSIS — R918 Other nonspecific abnormal finding of lung field: Secondary | ICD-10-CM | POA: Diagnosis not present

## 2020-08-01 NOTE — Progress Notes (Signed)
HPI:  The patient is an 87 year old woman with a stable 5.1 to 5.2 cm fusiform ascending aortic aneurysm that has been stable since March 2019.  She continues to feel well without chest or back pain.  She denies any shortness of breath.  Current Outpatient Medications  Medication Sig Dispense Refill   cloNIDine (CATAPRES) 0.1 MG tablet Take 1 tablet (0.1 mg total) by mouth at bedtime. 90 tablet 3   cloNIDine (CATAPRES) 0.2 MG tablet Take 1 tablet (0.2 mg total) by mouth every morning. 90 tablet 3   furosemide (LASIX) 40 MG tablet Take 40 mg by mouth daily as needed.      metoprolol succinate (TOPROL-XL) 100 MG 24 hr tablet Take 100 mg by mouth daily. Take with or immediately following a meal.     valsartan (DIOVAN) 320 MG tablet TAKE 1 TABLET BY MOUTH ONCE DAILY IN THE MORNING 30 tablet 10   amLODipine (NORVASC) 5 MG tablet Take 1 tablet (5 mg total) by mouth daily. 90 tablet 3   aspirin 81 MG tablet Take 81 mg by mouth daily. (Patient not taking: Reported on 08/01/2020)     Biotin 1000 MCG tablet Take 1,000 mcg by mouth daily. (Patient not taking: Reported on 08/01/2020)     calcium citrate-vitamin D (CITRACAL+D) 315-200 MG-UNIT tablet Take 1 tablet by mouth 2 (two) times daily. (Patient not taking: Reported on 08/01/2020)     Cholecalciferol (VITAMIN D-3) 1000 units CAPS Take 1 capsule by mouth daily. (Patient not taking: Reported on 08/01/2020)     Multiple Vitamins-Minerals (MULTIVITAMIN WITH MINERALS) tablet Take 1 tablet by mouth daily. (Patient not taking: Reported on 08/01/2020)     rosuvastatin (CRESTOR) 20 MG tablet Take 1 tablet (20 mg total) by mouth daily. 90 tablet 3   spironolactone (ALDACTONE) 25 MG tablet Take 1 tablet (25 mg total) by mouth daily. 90 tablet 3   No current facility-administered medications for this visit.     Physical Exam: BP (!) 154/74 (BP Location: Left Arm, Patient Position: Sitting)   Pulse 77   Resp 20   Ht 5\' 4"  (1.626 m)   Wt 200 lb (90.7 kg)    SpO2 94% Comment: RA  BMI 34.33 kg/m  She looks well. Cardiac exam shows regular rate and rhythm with normal heart sounds.  There is no murmur. Lungs are clear.  Diagnostic Tests:  Narrative & Impression  CLINICAL DATA:  Follow-up thoracic aortic aneurysm   EXAM: CT CHEST WITHOUT CONTRAST   TECHNIQUE: Multidetector CT imaging of the chest was performed following the standard protocol without IV contrast.   COMPARISON:  02/01/2020, 07/27/2019, 01/19/2019, 07/14/2018, 12/15/2017, 05/05/2017   FINDINGS: Cardiovascular: Aortic atherosclerosis. Unchanged enlargement of the tubular ascending thoracic aorta, measuring up to 5.3 x 5.3 cm. Unchanged enlargement of the descending thoracic aorta, the distal descending thoracic aorta measuring up to 3.7 x 3.5 cm. Normal heart size. No pericardial effusion.   Mediastinum/Nodes: No enlarged mediastinal, hilar, or axillary lymph nodes. Thyroid gland, trachea, and esophagus demonstrate no significant findings.   Lungs/Pleura: Unchanged, bandlike scarring and atelectasis of the left lung base. Occasional stable, benign small pulmonary nodules, for example a 6 mm nodule of the anterior right upper lobe (series 8, image 50) and adjacent nodules of the left lung base measuring 5 mm and smaller (series 8, image 107). No pleural effusion or pneumothorax.   Upper Abdomen: No acute abnormality. 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   Musculoskeletal: No chest wall mass or suspicious bone lesions identified.   IMPRESSION: 1. Unchanged enlargement of the tubular ascending thoracic aorta, measuring up to 5.3 x 5.3 cm. Unchanged enlargement of the descending thoracic aorta, the distal descending thoracic aorta measuring up to 3.7 x 3.5 cm. Ascending thoracic aortic aneurysm. Recommend semi-annual imaging followup by CTA or MRA and referral  to cardiothoracic surgery if not already obtained. This recommendation follows 2010 ACCF/AHA/AATS/ACR/ASA/SCA/SCAI/SIR/STS/SVM Guidelines for the Diagnosis and Management of Patients With Thoracic Aortic Disease. Circulation. 2010; 121: C944-H675. Aortic aneurysm NOS (ICD10-I71.9) 2. 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 3. Stable, definitively benign small pulmonary nodules, for which no further follow-up or characterization is required.   Aortic Atherosclerosis (ICD10-I70.0).     Electronically Signed   By: Eddie Candle M.D.   On: 08/01/2020 14:27      Impression:  This 87 year old woman has a stable 5.3 cm fusiform ascending aortic aneurysm which has not changed much dating back to 2019.  Previous echocardiogram 1 year ago showed a trileaflet aortic valve with mild thickening and calcification with mild regurgitation.  There was no stenosis.  Her aneurysm is below the surgical threshold of 5.5 cm and given her advanced age I would be very conservative in this patient.  I reviewed the CTA images with her and answered all of her questions.  I stressed the importance of continued good blood pressure control in preventing further enlargement and acute aortic dissection.  I cautioned her against doing any heavy lifting that might require a Valsalva maneuver and against taking quinolone antibiotics that have been associated with enlargement of aortic aneurysms.  Plan:  She will return to see me in 1 year with a CTA of the chest.  I spent 15 minutes performing this established patient evaluation and > 50% of this time was spent face to face counseling and coordinating the care of this patient's aortic aneurysm.    Gaye Pollack, MD [facility] [phone]

## 2020-08-14 DIAGNOSIS — H04123 Dry eye syndrome of bilateral lacrimal glands: Secondary | ICD-10-CM | POA: Diagnosis not present

## 2020-08-14 DIAGNOSIS — H52223 Regular astigmatism, bilateral: Secondary | ICD-10-CM | POA: Diagnosis not present

## 2020-08-14 DIAGNOSIS — H5203 Hypermetropia, bilateral: Secondary | ICD-10-CM | POA: Diagnosis not present

## 2020-08-14 DIAGNOSIS — Z23 Encounter for immunization: Secondary | ICD-10-CM | POA: Diagnosis not present

## 2020-09-20 ENCOUNTER — Ambulatory Visit (INDEPENDENT_AMBULATORY_CARE_PROVIDER_SITE_OTHER): Payer: Medicare Other | Admitting: Physician Assistant

## 2020-09-20 ENCOUNTER — Encounter: Payer: Self-pay | Admitting: Physician Assistant

## 2020-09-20 ENCOUNTER — Other Ambulatory Visit: Payer: Self-pay

## 2020-09-20 VITALS — BP 152/66 | HR 67 | Resp 18 | Ht 64.0 in | Wt 202.8 lb

## 2020-09-20 DIAGNOSIS — R7303 Prediabetes: Secondary | ICD-10-CM

## 2020-09-20 DIAGNOSIS — I712 Thoracic aortic aneurysm, without rupture, unspecified: Secondary | ICD-10-CM

## 2020-09-20 DIAGNOSIS — I10 Essential (primary) hypertension: Secondary | ICD-10-CM

## 2020-09-20 DIAGNOSIS — E785 Hyperlipidemia, unspecified: Secondary | ICD-10-CM | POA: Diagnosis not present

## 2020-09-20 NOTE — Patient Instructions (Signed)
Medication Instructions:  Continue current medications  *If you need a refill on your cardiac medications before your next appointment, please call your pharmacy*   Lab Work: None Ordered  If you have labs (blood work) drawn today and your tests are completely normal, you will receive your results only by: [place] (if you have MyChart) OR A paper copy in the mail If you have any lab test that is abnormal or we need to change your treatment, we will call you to review the results.   Testing/Procedures: None Ordered   Follow-Up: At [facility], you and your health needs are our priority.  As part of our continuing mission to provide you with exceptional heart care, we have created designated Provider Care Teams.  These Care Teams include your primary Cardiologist (physician) and Advanced Practice Providers (APPs -  Physician Assistants and Nurse Practitioners) who all work together to provide you with the care you need, when you need it.  We recommend signing up for the patient portal called "MyChart".  Sign up information is provided on this After Visit Summary.  MyChart is used to connect with patients for Virtual Visits (Telemedicine).  Patients are able to view lab/test results, encounter notes, upcoming appointments, etc.  Non-urgent messages can be sent to your provider as well.   To learn more about what you can do with MyChart, go to [email or web address].    Your next appointment:   6 month(s)  The format for your next appointment:   In Person  Provider:   You may see Kirk Ruths, MD or one of the following Advanced Practice Providers on your designated Care Team:   [place], PA-C Coletta Memos, FNP

## 2020-09-20 NOTE — Progress Notes (Signed)
Cardiology Office Note:    Date:  09/22/2020   ID:  Bridget Evans, DOB 07-21-1931, MRN [id number]  PCP:  Cyndi Bender, PA-C   [facility] Providers Cardiologist:  Kirk Ruths, MD     Referring MD: Cyndi Bender, PA-C   Chief Complaint  Patient presents with   Follow-up    Seen for Dr. Stanford Breed    History of Present Illness:    Bridget Evans is a 87 y.o. female with a hx of hypertension, hyperlipidemia, prediabetes, CKD stage III and thoracic aortic aneurysm.  Previous stress echo in February 2017 was normal.  Echocardiogram in January 2019 showed normal EF, grade 1 DD, mild AI, moderately dilated aortic root, mild MR and TR.  CTA in March 2019 showed fusiform aneurysmal dilatation of the ascending aorta measuring 5.1 cm.  He was seen by Dr. Cyndia Bent who recommended follow-up CTs.  Echocardiogram in May 2021 showed normal EF, grade 1 DD, moderate RAE, moderate AI and TAA.  Chest CT in December 2021 showed 5.2 cm ascending nodule.  Patient was last seen by Dr. Stanford Breed in February 2022 at which time she was doing well.  Blood pressure was elevated at the time, she was just prescribed spironolactone by her PCP.  Aortic aneurysm and a stable right upper lobe.  Recent CTA obtained in June 2022 showed thoracic aortic aneurysm dilated to 5.3 cm.  Dr. Cyndia Bent recommended 1 year follow-up with CTA of the chest.  Patient presents today for follow-up.  She denies any chest pain or worsening dyspnea.  Based on blood pressure reading at home, systolic blood pressure has been ranging in the high 90s to 130s.  Overall, her blood pressure is very well controlled on the current therapy.  She can follow-up in 6 months.   Past Medical History:  Diagnosis Date   Calculus of kidney    Chronic kidney disease    stage 3   Edema    Glaucoma    Hyperlipidemia    Hypertension    Lumbar spondylosis    Osteopenia    Palpitations    Postural kyphosis of lumbar region    Pre-diabetes    Rosacea     Vitamin D deficiency     Past Surgical History:  Procedure Laterality Date   ABDOMINAL HYSTERECTOMY     COLON SURGERY  2008   mass removed sigmoid    Current Medications: Current Meds  Medication Sig   aspirin 81 MG tablet Take 81 mg by mouth daily.   Biotin 1000 MCG tablet Take 1,000 mcg by mouth daily.   calcium citrate-vitamin D (CITRACAL+D) 315-200 MG-UNIT tablet Take 1 tablet by mouth 2 (two) times daily.   Cholecalciferol (VITAMIN D-3) 1000 units CAPS Take 1 capsule by mouth daily.   cloNIDine (CATAPRES) 0.1 MG tablet Take 1 tablet (0.1 mg total) by mouth at bedtime.   cloNIDine (CATAPRES) 0.2 MG tablet Take 1 tablet (0.2 mg total) by mouth every morning.   furosemide (LASIX) 40 MG tablet Take 40 mg by mouth daily as needed.    metoprolol succinate (TOPROL-XL) 100 MG 24 hr tablet Take 100 mg by mouth daily. Take with or immediately following a meal.   valsartan (DIOVAN) 320 MG tablet TAKE 1 TABLET BY MOUTH ONCE DAILY IN THE MORNING     Allergies:   Patient has no known allergies.   Social History   Socioeconomic History   Marital status: Married    Spouse name: Not on file   Number  of children: 2   Years of education: Not on file   Highest education level: Not on file  Occupational History   Not on file  Tobacco Use   Smoking status: Never   Smokeless tobacco: Never  Vaping Use   Vaping Use: Never used  Substance and Sexual Activity   Alcohol use: No    Alcohol/week: 0.0 standard drinks   Drug use: Never   Sexual activity: Not on file  Other Topics Concern   Not on file  Social History Narrative   Not on file   Social Determinants of Health   Financial Resource Strain: Not on file  Food Insecurity: Not on file  Transportation Needs: Not on file  Physical Activity: Not on file  Stress: Not on file  Social Connections: Not on file     Family History: The patient's family history includes Breast cancer in her sister; CVA in her mother; Cancer in her  father and sister; Colon cancer in her brother; Heart attack in her brother.  ROS:   Please see the history of present illness.     All other systems reviewed and are negative.  EKGs/Labs/Other Studies Reviewed:    The following studies were reviewed today:  Echo 06/28/2019  1. Left ventricular ejection fraction, by estimation, is 60 to 65%. The  left ventricle has normal function. The left ventricle has no regional  wall motion abnormalities. Left ventricular diastolic parameters are  consistent with Grade I diastolic  dysfunction (impaired relaxation). Elevated left ventricular end-diastolic  pressure.   2. Right ventricular systolic function is normal. The right ventricular  size is normal. There is normal pulmonary artery systolic pressure.   3. Right atrial size was moderately dilated.   4. The mitral valve is normal in structure. Trivial mitral valve  regurgitation. No evidence of mitral stenosis.   5. The aortic valve is normal in structure. Aortic valve regurgitation is  mild. No aortic stenosis is present. Aortic regurgitation PHT measures 544  msec.   6. Severe ascending aortic aneurysm unchanged from chest CT 01/2019.  Aortic dilatation noted. Aneurysm of the ascending aorta, measuring 51 mm.  There is mild dilatation at the level of the sinuses of Valsalva measuring  38 mm.   7. The inferior vena cava is normal in size with greater than 50%  respiratory variability, suggesting right atrial pressure of 3 mmHg.   EKG:  EKG is ordered today.  The ekg ordered today demonstrates rhythm, no significant ST-T wave changes  Recent Labs: 04/03/2020: ALT 18; BUN 20; Creatinine, Ser 0.79; Potassium 4.3; Sodium 140  Recent Lipid Panel    Component Value Date/Time   CHOL 127 04/03/2020 0820   TRIG 159 (H) 04/03/2020 0820   HDL 39 (L) 04/03/2020 0820   CHOLHDL 3.3 04/03/2020 0820   LDLCALC 61 04/03/2020 0820     Risk Assessment/Calculations:           Physical Exam:     VS:  BP (!) 152/66   Pulse 67   Resp 18   Ht '5\' 4"'$  (1.626 m)   Wt 202 lb 12.8 oz (92 kg)   SpO2 95%   BMI 34.81 kg/m     Wt Readings from Last 3 Encounters:  09/20/20 202 lb 12.8 oz (92 kg)  08/01/20 200 lb (90.7 kg)  03/23/20 203 lb (92.1 kg)     GEN: Well nourished, well developed in no acute distress HEENT: Normal NECK: No JVD; No carotid bruits LYMPHATICS: No  lymphadenopathy CARDIAC: RRR, no murmurs, rubs, gallops RESPIRATORY:  Clear to auscultation without rales, wheezing or rhonchi  ABDOMEN: Soft, non-tender, non-distended MUSCULOSKELETAL:  No edema; No deformity  SKIN: Warm and dry NEUROLOGIC:  Alert and oriented x 3 PSYCHIATRIC:  Normal affect   ASSESSMENT:    1. Hypertension, essential   2. Hyperlipidemia LDL goal <100   3. Prediabetes   4. Thoracic aortic aneurysm without rupture (HCC)    PLAN:    In order of problems listed above:  Hypertension: Blood pressure well controlled on current therapy based on home diarrhea  Hyperlipidemia: On Crestor  Prediabetes: Followed by primary care provider  Thoracic aortic aneurysm: Followed by Dr. Cyndia Bent of cardiothoracic surgery        Medication Adjustments/Labs and Tests Ordered: Current medicines are reviewed at length with the patient today.  Concerns regarding medicines are outlined above.  Orders Placed This Encounter  Procedures   EKG 12-Lead   No orders of the defined types were placed in this encounter.   Patient Instructions  Medication Instructions:  Continue current medications  *If you need a refill on your cardiac medications before your next appointment, please call your pharmacy*   Lab Work: None Ordered  If you have labs (blood work) drawn today and your tests are completely normal, you will receive your results only by: [place] (if you have MyChart) OR A paper copy in the mail If you have any lab test that is abnormal or we need to change your treatment, we will call  you to review the results.   Testing/Procedures: None Ordered   Follow-Up: At [facility], you and your health needs are our priority.  As part of our continuing mission to provide you with exceptional heart care, we have created designated Provider Care Teams.  These Care Teams include your primary Cardiologist (physician) and Advanced Practice Providers (APPs -  Physician Assistants and Nurse Practitioners) who all work together to provide you with the care you need, when you need it.  We recommend signing up for the patient portal called "MyChart".  Sign up information is provided on this After Visit Summary.  MyChart is used to connect with patients for Virtual Visits (Telemedicine).  Patients are able to view lab/test results, encounter notes, upcoming appointments, etc.  Non-urgent messages can be sent to your provider as well.   To learn more about what you can do with MyChart, go to [email or web address].    Your next appointment:   6 month(s)  The format for your next appointment:   In Person  Provider:   You may see Kirk Ruths, MD or one of the following Advanced Practice Providers on your designated Care Team:   Sande Rives, PA-C Coletta Memos, FNP   Signed, Almyra Deforest, [place]  09/22/2020 11:54 PM    [place]

## 2020-09-22 ENCOUNTER — Encounter: Payer: Self-pay | Admitting: Physician Assistant

## 2020-10-19 ENCOUNTER — Other Ambulatory Visit: Payer: Self-pay

## 2020-10-19 ENCOUNTER — Other Ambulatory Visit: Payer: Self-pay | Admitting: Cardiology

## 2020-10-22 DIAGNOSIS — R7303 Prediabetes: Secondary | ICD-10-CM | POA: Diagnosis not present

## 2020-10-22 DIAGNOSIS — N183 Chronic kidney disease, stage 3 unspecified: Secondary | ICD-10-CM | POA: Diagnosis not present

## 2020-10-22 DIAGNOSIS — I712 Thoracic aortic aneurysm, without rupture: Secondary | ICD-10-CM | POA: Diagnosis not present

## 2020-10-22 DIAGNOSIS — R609 Edema, unspecified: Secondary | ICD-10-CM | POA: Diagnosis not present

## 2020-10-22 DIAGNOSIS — I10 Essential (primary) hypertension: Secondary | ICD-10-CM | POA: Diagnosis not present

## 2020-10-22 DIAGNOSIS — G250 Essential tremor: Secondary | ICD-10-CM | POA: Diagnosis not present

## 2020-10-22 DIAGNOSIS — E7800 Pure hypercholesterolemia, unspecified: Secondary | ICD-10-CM | POA: Diagnosis not present

## 2020-10-22 DIAGNOSIS — Z6836 Body mass index (BMI) 36.0-36.9, adult: Secondary | ICD-10-CM | POA: Diagnosis not present

## 2020-10-22 DIAGNOSIS — Z23 Encounter for immunization: Secondary | ICD-10-CM | POA: Diagnosis not present

## 2020-10-22 DIAGNOSIS — Z9181 History of falling: Secondary | ICD-10-CM | POA: Diagnosis not present

## 2020-10-22 DIAGNOSIS — Z1331 Encounter for screening for depression: Secondary | ICD-10-CM | POA: Diagnosis not present

## 2020-12-17 ENCOUNTER — Other Ambulatory Visit: Payer: Self-pay | Admitting: Cardiology

## 2021-01-08 ENCOUNTER — Other Ambulatory Visit: Payer: Self-pay | Admitting: Cardiology

## 2021-01-08 DIAGNOSIS — I10 Essential (primary) hypertension: Secondary | ICD-10-CM

## 2021-01-09 DIAGNOSIS — Z23 Encounter for immunization: Secondary | ICD-10-CM | POA: Diagnosis not present

## 2021-01-21 ENCOUNTER — Other Ambulatory Visit: Payer: Self-pay | Admitting: Cardiology

## 2021-03-27 NOTE — Progress Notes (Unsigned)
HPI: FU TAA. Stress echocardiogram 2/17 normal. Seen for CP 12/18. Echo 1/19 showed normal LV function, grade 1 DD, mild AI, moderately dilated aortic root, mild MR and TR. CTA 3/19 showed fusiform aneurysmal dilatation of ascending aorta measuring 5.1 cm. Seen by Dr Cyndia Bent and FU CTs recommended. Echo 5/21 showed normal LV function; grade 1 DD; moderate RAE; mild AI; TAA. Chest CT in June 2022 showed 5.3 x 5.3 ascending thoracic aortic aneurysm and 3.7 x 3.5 cm descending aneurysm.  Since last seen,   Current Outpatient Medications  Medication Sig Dispense Refill   amLODipine (NORVASC) 10 MG tablet Take 1 tablet by mouth once daily 90 tablet 3   amLODipine (NORVASC) 5 MG tablet Take 1 tablet (5 mg total) by mouth daily. 90 tablet 3   aspirin 81 MG tablet Take 81 mg by mouth daily.     Biotin 1000 MCG tablet Take 1,000 mcg by mouth daily.     calcium citrate-vitamin D (CITRACAL+D) 315-200 MG-UNIT tablet Take 1 tablet by mouth 2 (two) times daily.     Cholecalciferol (VITAMIN D-3) 1000 units CAPS Take 1 capsule by mouth daily.     cloNIDine (CATAPRES) 0.1 MG tablet Take 1 tablet (0.1 mg total) by mouth at bedtime. 90 tablet 3   cloNIDine (CATAPRES) 0.2 MG tablet Take 1 tablet (0.2 mg total) by mouth every morning. 90 tablet 3   furosemide (LASIX) 40 MG tablet Take 40 mg by mouth daily as needed.      metoprolol succinate (TOPROL-XL) 100 MG 24 hr tablet Take 100 mg by mouth daily. Take with or immediately following a meal.     rosuvastatin (CRESTOR) 20 MG tablet Take 1 tablet (20 mg total) by mouth daily. 90 tablet 3   valsartan (DIOVAN) 320 MG tablet TAKE 1 TABLET BY MOUTH ONCE DAILY IN THE MORNING 90 tablet 1   No current facility-administered medications for this visit.     Past Medical History:  Diagnosis Date   Calculus of kidney    Chronic kidney disease    stage 3   Edema    Glaucoma    Hyperlipidemia    Hypertension    Lumbar spondylosis    Osteopenia    Palpitations     Postural kyphosis of lumbar region    Pre-diabetes    Rosacea    Vitamin D deficiency     Past Surgical History:  Procedure Laterality Date   ABDOMINAL HYSTERECTOMY     COLON SURGERY  2008   mass removed sigmoid    Social History   Socioeconomic History   Marital status: Married    Spouse name: Not on file   Number of children: 2   Years of education: Not on file   Highest education level: Not on file  Occupational History   Not on file  Tobacco Use   Smoking status: Never   Smokeless tobacco: Never  Vaping Use   Vaping Use: Never used  Substance and Sexual Activity   Alcohol use: No    Alcohol/week: 0.0 standard drinks   Drug use: Never   Sexual activity: Not on file  Other Topics Concern   Not on file  Social History Narrative   Not on file   Social Determinants of Health   Financial Resource Strain: Not on file  Food Insecurity: Not on file  Transportation Needs: Not on file  Physical Activity: Not on file  Stress: Not on file  Social Connections: Not  on file  Intimate Partner Violence: Not on file    Family History  Problem Relation Age of Onset   CVA Mother    Cancer Father    Cancer Sister    Heart attack Brother    Colon cancer Brother    Breast cancer Sister     ROS: no fevers or chills, productive cough, hemoptysis, dysphasia, odynophagia, melena, hematochezia, dysuria, hematuria, rash, seizure activity, orthopnea, PND, pedal edema, claudication. Remaining systems are negative.  Physical Exam: Well-developed well-nourished in no acute distress.  Skin is warm and dry.  HEENT is normal.  Neck is supple.  Chest is clear to auscultation with normal expansion.  Cardiovascular exam is regular rate and rhythm.  Abdominal exam nontender or distended. No masses palpated. Extremities show no edema. neuro grossly intact  ECG- personally reviewed  A/P  1 thoracic aortic aneurysm-plan follow-up CTA June 2023.  Patient is followed by Dr.  Cyndia Bent.  Given age would managed conservatively.  2 hypertension-blood pressure controlled.  Continue present medical regimen and follow.  3 hyperlipidemia-continue statin.  4 lower extremity edema-reasonly well controlled.  Continue Lasix.  Kirk Ruths, MD

## 2021-04-08 ENCOUNTER — Ambulatory Visit: Payer: Medicare Other | Admitting: Cardiology

## 2021-05-08 DIAGNOSIS — N183 Chronic kidney disease, stage 3 unspecified: Secondary | ICD-10-CM | POA: Diagnosis not present

## 2021-05-08 DIAGNOSIS — R7303 Prediabetes: Secondary | ICD-10-CM | POA: Diagnosis not present

## 2021-05-08 DIAGNOSIS — I7121 Aneurysm of the ascending aorta, without rupture: Secondary | ICD-10-CM | POA: Diagnosis not present

## 2021-05-08 DIAGNOSIS — R609 Edema, unspecified: Secondary | ICD-10-CM | POA: Diagnosis not present

## 2021-05-08 DIAGNOSIS — L570 Actinic keratosis: Secondary | ICD-10-CM | POA: Diagnosis not present

## 2021-05-08 DIAGNOSIS — Z6836 Body mass index (BMI) 36.0-36.9, adult: Secondary | ICD-10-CM | POA: Diagnosis not present

## 2021-05-08 DIAGNOSIS — G250 Essential tremor: Secondary | ICD-10-CM | POA: Diagnosis not present

## 2021-05-08 DIAGNOSIS — I10 Essential (primary) hypertension: Secondary | ICD-10-CM | POA: Diagnosis not present

## 2021-05-08 DIAGNOSIS — E7800 Pure hypercholesterolemia, unspecified: Secondary | ICD-10-CM | POA: Diagnosis not present

## 2021-06-19 ENCOUNTER — Other Ambulatory Visit: Payer: Self-pay | Admitting: Surgery

## 2021-06-19 DIAGNOSIS — I7121 Aneurysm of the ascending aorta, without rupture: Secondary | ICD-10-CM

## 2021-08-14 ENCOUNTER — Ambulatory Visit
Admission: RE | Admit: 2021-08-14 | Discharge: 2021-08-14 | Disposition: A | Discharge status: Home / Self Care | Payer: Medicare Other | Source: Ambulatory Visit | Attending: Surgery | Admitting: Surgery

## 2021-08-14 ENCOUNTER — Other Ambulatory Visit: Payer: Medicare Other

## 2021-08-14 ENCOUNTER — Encounter: Payer: Self-pay | Admitting: Surgery

## 2021-08-14 ENCOUNTER — Ambulatory Visit (INDEPENDENT_AMBULATORY_CARE_PROVIDER_SITE_OTHER): Payer: Medicare Other | Admitting: Surgery

## 2021-08-14 VITALS — BP 160/74 | HR 76 | Resp 20 | Ht 64.0 in | Wt 197.0 lb

## 2021-08-14 DIAGNOSIS — I7121 Aneurysm of the ascending aorta, without rupture: Secondary | ICD-10-CM

## 2021-08-16 NOTE — Progress Notes (Signed)
HPI:  The patient is an 88 year old woman who returns for follow-up of a 5.3 cm fusiform ascending aortic aneurysm which has not changed much dating back to March 2019 when it was measured at 5.1 to 5.2 cm.  She continues to feel well without chest or back pain and has no shortness of breath.  She continues to work about 30 hours/week.  Current Outpatient Medications  Medication Sig Dispense Refill   amLODipine (NORVASC) 10 MG tablet Take 1 tablet by mouth once daily 90 tablet 3   aspirin 81 MG tablet Take 81 mg by mouth daily.     Biotin 1000 MCG tablet Take 1,000 mcg by mouth daily.     calcium citrate-vitamin D (CITRACAL+D) 315-200 MG-UNIT tablet Take 1 tablet by mouth 2 (two) times daily.     Cholecalciferol (VITAMIN D-3) 1000 units CAPS Take 1 capsule by mouth daily.     cloNIDine (CATAPRES) 0.1 MG tablet Take 1 tablet (0.1 mg total) by mouth at bedtime. 90 tablet 3   cloNIDine (CATAPRES) 0.2 MG tablet Take 1 tablet (0.2 mg total) by mouth every morning. 90 tablet 3   furosemide (LASIX) 40 MG tablet Take 40 mg by mouth daily as needed.      metoprolol succinate (TOPROL-XL) 100 MG 24 hr tablet Take 100 mg by mouth daily. Take with or immediately following a meal.     spironolactone (ALDACTONE) 25 MG tablet Take 25 mg by mouth daily.     valsartan (DIOVAN) 320 MG tablet TAKE 1 TABLET BY MOUTH ONCE DAILY IN THE MORNING 90 tablet 1   amLODipine (NORVASC) 5 MG tablet Take 1 tablet (5 mg total) by mouth daily. 90 tablet 3   rosuvastatin (CRESTOR) 20 MG tablet Take 1 tablet (20 mg total) by mouth daily. (Patient taking differently: Take 10 mg by mouth daily.) 90 tablet 3   No current facility-administered medications for this visit.     Physical Exam: BP (!) 160/74   Pulse 76   Resp 20   Ht '5\' 4"'$  (1.626 m)   Wt 197 lb (89.4 kg)   SpO2 96% Comment: RA  BMI 33.81 kg/m  She looks well. Cardiac exam shows regular rate and rhythm with normal heart sounds.  There is no murmur. Lungs  are clear. There is no peripheral edema.  Diagnostic Tests:  Narrative & Impression  CLINICAL DATA:  Follow-up ascending thoracic aortic aneurysm.   EXAM: CT CHEST WITHOUT CONTRAST   TECHNIQUE: Multidetector CT imaging of the chest was performed following the standard protocol without IV contrast.   RADIATION DOSE REDUCTION: This exam was performed according to the departmental dose-optimization program which includes automated exposure control, adjustment of the mA and/or kV according to patient size and/or use of iterative reconstruction technique.   COMPARISON:  Multiple prior studies dating back to 2019. The most recent CT scan is 08/01/2020   FINDINGS: Cardiovascular: The heart is normal in size. No pericardial effusion. Stable tortuosity, ectasia and calcification of the thoracic aorta. Fusiform aneurysmal dilatation the ascending thoracic aorta with maximum measurement of 5.3 cm. This is unchanged. Ascending thoracic aortic aneurysm. Recommend semi-annual imaging followup by CTA or MRA and referral to cardiothoracic surgery if not already obtained. This recommendation follows 2010 ACCF/AHA/AATS/ACR/ASA/SCA/SCAI/SIR/STS/SVM Guidelines for the Diagnosis and Management of Patients With Thoracic Aortic Disease. Circulation. 2010; 121: [id number]. Aortic aneurysm NOS (ICD10-I71.9)   Stable three-vessel coronary artery calcifications.   Mediastinum/Nodes: No mediastinal or hilar mass or lymphadenopathy. Stable scattered lymph nodes.  The esophagus is grossly normal. There is a small hiatal hernia. Thyroid gland is grossly normal.   Lungs/Pleura: No acute pulmonary process. Right middle lobe scarring changes with tiny calcifications and probable chronic mucoid impaction. This is likely postinflammatory.   Stable 6 mm right upper lobe pulmonary nodule on image number 51/80 and stable 5 mm nodules at the left lung base. These are unchanged since 2019 and are considered  benign. No new pulmonary nodules. No pleural effusion.   Upper Abdomen: No significant upper abdominal findings. Stable scattered calcified granulomas in the liver. Stable advanced aortic calcifications. 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   Musculoskeletal: No breast masses, supraclavicular or axillary adenopathy. Stable scoliosis and degenerative changes involving the spine.   IMPRESSION: Stable 5.3 cm ascending thoracic aortic aneurysm. Recommend semi-annual imaging followup by CTA or MRA and referral to cardiothoracic surgery if not already obtained. 2010; 121: [id number].   Stable benign pulmonary nodules.   Stable remote postinflammatory or post infectious changes in right middle lobe.   Aortic Atherosclerosis (ICD10-I70.0).     Electronically Signed   By: Marijo Sanes M.D.   On: 08/14/2021 10:39      Impression:  She has a stable 5.3 cm fusiform ascending aortic aneurysm with significant calcification of the wall.  There has been minimal if any change dating back to 2019.  Given her advanced age I do not think she would be a candidate for surgical repair even if this increases in size further to 5.5 cm.  I reviewed the CT images with her and her daughter-in-law.  I told her that I did not think we needed to continue following this with CT scan at her age since she would not be an operative candidate for repair and it has been stable.  I stressed the importance of continued good blood pressure control in preventing further enlargement and acute aortic dissection.  She is in agreement with discontinuing further CT scans.    Plan:  She will continue to follow-up with her PCP and cardiology for hypertension management.  I spent 20 minutes performing this established patient evaluation and > 50% of this time was spent face to face counseling and coordinating the care of this patient's aortic aneurysm.    Gaye Pollack, MD [facility] [phone]

## 2021-08-19 ENCOUNTER — Other Ambulatory Visit: Payer: Self-pay | Admitting: Cardiology

## 2021-11-07 DIAGNOSIS — Z23 Encounter for immunization: Secondary | ICD-10-CM | POA: Diagnosis not present

## 2021-11-13 DIAGNOSIS — Z23 Encounter for immunization: Secondary | ICD-10-CM | POA: Diagnosis not present

## 2021-11-13 DIAGNOSIS — R609 Edema, unspecified: Secondary | ICD-10-CM | POA: Diagnosis not present

## 2021-11-13 DIAGNOSIS — N183 Chronic kidney disease, stage 3 unspecified: Secondary | ICD-10-CM | POA: Diagnosis not present

## 2021-11-13 DIAGNOSIS — I10 Essential (primary) hypertension: Secondary | ICD-10-CM | POA: Diagnosis not present

## 2021-11-13 DIAGNOSIS — Z139 Encounter for screening, unspecified: Secondary | ICD-10-CM | POA: Diagnosis not present

## 2021-11-13 DIAGNOSIS — E7800 Pure hypercholesterolemia, unspecified: Secondary | ICD-10-CM | POA: Diagnosis not present

## 2021-11-13 DIAGNOSIS — I7121 Aneurysm of the ascending aorta, without rupture: Secondary | ICD-10-CM | POA: Diagnosis not present

## 2021-11-13 DIAGNOSIS — Z9181 History of falling: Secondary | ICD-10-CM | POA: Diagnosis not present

## 2021-11-13 DIAGNOSIS — Z1331 Encounter for screening for depression: Secondary | ICD-10-CM | POA: Diagnosis not present

## 2021-11-13 DIAGNOSIS — R7303 Prediabetes: Secondary | ICD-10-CM | POA: Diagnosis not present

## 2021-11-28 DIAGNOSIS — S0502XA Injury of conjunctiva and corneal abrasion without foreign body, left eye, initial encounter: Secondary | ICD-10-CM | POA: Diagnosis not present

## 2021-12-11 DIAGNOSIS — R609 Edema, unspecified: Secondary | ICD-10-CM | POA: Diagnosis not present

## 2021-12-11 DIAGNOSIS — I7121 Aneurysm of the ascending aorta, without rupture: Secondary | ICD-10-CM | POA: Diagnosis not present

## 2021-12-11 DIAGNOSIS — I10 Essential (primary) hypertension: Secondary | ICD-10-CM | POA: Diagnosis not present

## 2022-01-15 DIAGNOSIS — I10 Essential (primary) hypertension: Secondary | ICD-10-CM | POA: Diagnosis not present

## 2022-01-15 DIAGNOSIS — M47816 Spondylosis without myelopathy or radiculopathy, lumbar region: Secondary | ICD-10-CM | POA: Diagnosis not present

## 2022-01-15 DIAGNOSIS — N183 Chronic kidney disease, stage 3 unspecified: Secondary | ICD-10-CM | POA: Diagnosis not present

## 2022-01-15 DIAGNOSIS — I7121 Aneurysm of the ascending aorta, without rupture: Secondary | ICD-10-CM | POA: Diagnosis not present

## 2022-02-17 DIAGNOSIS — N183 Chronic kidney disease, stage 3 unspecified: Secondary | ICD-10-CM | POA: Diagnosis not present

## 2022-03-06 DIAGNOSIS — E875 Hyperkalemia: Secondary | ICD-10-CM | POA: Diagnosis not present

## 2022-04-15 DIAGNOSIS — M545 Low back pain, unspecified: Secondary | ICD-10-CM | POA: Diagnosis not present

## 2022-04-15 DIAGNOSIS — M47816 Spondylosis without myelopathy or radiculopathy, lumbar region: Secondary | ICD-10-CM | POA: Diagnosis not present

## 2022-04-16 DIAGNOSIS — I7121 Aneurysm of the ascending aorta, without rupture: Secondary | ICD-10-CM | POA: Diagnosis not present

## 2022-04-16 DIAGNOSIS — N183 Chronic kidney disease, stage 3 unspecified: Secondary | ICD-10-CM | POA: Diagnosis not present

## 2022-04-16 DIAGNOSIS — I10 Essential (primary) hypertension: Secondary | ICD-10-CM | POA: Diagnosis not present

## 2022-04-16 DIAGNOSIS — M47816 Spondylosis without myelopathy or radiculopathy, lumbar region: Secondary | ICD-10-CM | POA: Diagnosis not present

## 2022-05-01 DIAGNOSIS — R2689 Other abnormalities of gait and mobility: Secondary | ICD-10-CM | POA: Diagnosis not present

## 2022-05-01 DIAGNOSIS — M2560 Stiffness of unspecified joint, not elsewhere classified: Secondary | ICD-10-CM | POA: Diagnosis not present

## 2022-05-01 DIAGNOSIS — M5489 Other dorsalgia: Secondary | ICD-10-CM | POA: Diagnosis not present

## 2022-05-08 DIAGNOSIS — M5489 Other dorsalgia: Secondary | ICD-10-CM | POA: Diagnosis not present

## 2022-05-08 DIAGNOSIS — R2689 Other abnormalities of gait and mobility: Secondary | ICD-10-CM | POA: Diagnosis not present

## 2022-05-08 DIAGNOSIS — M2560 Stiffness of unspecified joint, not elsewhere classified: Secondary | ICD-10-CM | POA: Diagnosis not present

## 2022-05-20 DIAGNOSIS — M5489 Other dorsalgia: Secondary | ICD-10-CM | POA: Diagnosis not present

## 2022-05-20 DIAGNOSIS — M2560 Stiffness of unspecified joint, not elsewhere classified: Secondary | ICD-10-CM | POA: Diagnosis not present

## 2022-05-20 DIAGNOSIS — R2689 Other abnormalities of gait and mobility: Secondary | ICD-10-CM | POA: Diagnosis not present

## 2022-05-28 DIAGNOSIS — M2560 Stiffness of unspecified joint, not elsewhere classified: Secondary | ICD-10-CM | POA: Diagnosis not present

## 2022-05-28 DIAGNOSIS — R2689 Other abnormalities of gait and mobility: Secondary | ICD-10-CM | POA: Diagnosis not present

## 2022-05-28 DIAGNOSIS — M5489 Other dorsalgia: Secondary | ICD-10-CM | POA: Diagnosis not present

## 2022-06-04 DIAGNOSIS — M5489 Other dorsalgia: Secondary | ICD-10-CM | POA: Diagnosis not present

## 2022-06-04 DIAGNOSIS — R2689 Other abnormalities of gait and mobility: Secondary | ICD-10-CM | POA: Diagnosis not present

## 2022-06-04 DIAGNOSIS — M2560 Stiffness of unspecified joint, not elsewhere classified: Secondary | ICD-10-CM | POA: Diagnosis not present

## 2022-06-11 DIAGNOSIS — M2560 Stiffness of unspecified joint, not elsewhere classified: Secondary | ICD-10-CM | POA: Diagnosis not present

## 2022-06-11 DIAGNOSIS — R2689 Other abnormalities of gait and mobility: Secondary | ICD-10-CM | POA: Diagnosis not present

## 2022-06-11 DIAGNOSIS — M5489 Other dorsalgia: Secondary | ICD-10-CM | POA: Diagnosis not present

## 2022-07-04 DIAGNOSIS — Z79899 Other long term (current) drug therapy: Secondary | ICD-10-CM | POA: Diagnosis not present

## 2022-07-04 DIAGNOSIS — M545 Low back pain, unspecified: Secondary | ICD-10-CM | POA: Diagnosis not present

## 2022-07-04 DIAGNOSIS — M47896 Other spondylosis, lumbar region: Secondary | ICD-10-CM | POA: Diagnosis not present

## 2022-07-04 DIAGNOSIS — Z5181 Encounter for therapeutic drug level monitoring: Secondary | ICD-10-CM | POA: Diagnosis not present

## 2022-07-24 DIAGNOSIS — N183 Chronic kidney disease, stage 3 unspecified: Secondary | ICD-10-CM | POA: Diagnosis not present

## 2022-07-24 DIAGNOSIS — I7121 Aneurysm of the ascending aorta, without rupture: Secondary | ICD-10-CM | POA: Diagnosis not present

## 2022-07-24 DIAGNOSIS — E7800 Pure hypercholesterolemia, unspecified: Secondary | ICD-10-CM | POA: Diagnosis not present

## 2022-07-24 DIAGNOSIS — M47816 Spondylosis without myelopathy or radiculopathy, lumbar region: Secondary | ICD-10-CM | POA: Diagnosis not present

## 2022-07-24 DIAGNOSIS — I10 Essential (primary) hypertension: Secondary | ICD-10-CM | POA: Diagnosis not present

## 2022-09-26 ENCOUNTER — Telehealth: Payer: Self-pay

## 2022-09-26 NOTE — Patient Outreach (Signed)
  Care Coordination   Initial Visit Note   09/26/2022 Name: Bridget Evans MRN: [id number] DOB: 1932/01/04  Bridget Evans is a 89 y.o. year old female who sees Lonie Peak, [place] for primary care. I spoke with  Bridget Evans by phone today.  What matters to the patients health and wellness today?  Placed call to patient today to review and offer [facility] care coordination program.. Patient reports that she is still working and doing well. Denies any needs today.    SDOH assessments and interventions completed:  No     Care Coordination Interventions:  No, not indicated   Follow up plan: No further intervention required.   Encounter Outcome:  Pt. Refused   Rowe Pavy, RN, BSN, CEN [facility] [phone]

## 2022-10-28 DIAGNOSIS — I7121 Aneurysm of the ascending aorta, without rupture: Secondary | ICD-10-CM | POA: Diagnosis not present

## 2022-10-28 DIAGNOSIS — N183 Chronic kidney disease, stage 3 unspecified: Secondary | ICD-10-CM | POA: Diagnosis not present

## 2022-10-28 DIAGNOSIS — I10 Essential (primary) hypertension: Secondary | ICD-10-CM | POA: Diagnosis not present

## 2022-10-28 DIAGNOSIS — M47816 Spondylosis without myelopathy or radiculopathy, lumbar region: Secondary | ICD-10-CM | POA: Diagnosis not present

## 2022-10-28 DIAGNOSIS — R7303 Prediabetes: Secondary | ICD-10-CM | POA: Diagnosis not present

## 2022-11-26 DIAGNOSIS — Z23 Encounter for immunization: Secondary | ICD-10-CM | POA: Diagnosis not present

## 2022-12-20 DIAGNOSIS — H04123 Dry eye syndrome of bilateral lacrimal glands: Secondary | ICD-10-CM | POA: Diagnosis not present

## 2022-12-20 DIAGNOSIS — H5203 Hypermetropia, bilateral: Secondary | ICD-10-CM | POA: Diagnosis not present

## 2022-12-20 DIAGNOSIS — H52223 Regular astigmatism, bilateral: Secondary | ICD-10-CM | POA: Diagnosis not present

## 2023-01-20 DIAGNOSIS — Z79899 Other long term (current) drug therapy: Secondary | ICD-10-CM | POA: Diagnosis not present

## 2023-01-20 DIAGNOSIS — M545 Low back pain, unspecified: Secondary | ICD-10-CM | POA: Diagnosis not present

## 2023-01-20 DIAGNOSIS — M47896 Other spondylosis, lumbar region: Secondary | ICD-10-CM | POA: Diagnosis not present

## 2023-02-13 DIAGNOSIS — M545 Low back pain, unspecified: Secondary | ICD-10-CM | POA: Diagnosis not present

## 2023-02-13 DIAGNOSIS — M5416 Radiculopathy, lumbar region: Secondary | ICD-10-CM | POA: Diagnosis not present

## 2023-02-24 DIAGNOSIS — M545 Low back pain, unspecified: Secondary | ICD-10-CM | POA: Diagnosis not present

## 2023-02-24 DIAGNOSIS — M47896 Other spondylosis, lumbar region: Secondary | ICD-10-CM | POA: Diagnosis not present

## 2023-02-24 DIAGNOSIS — Z79899 Other long term (current) drug therapy: Secondary | ICD-10-CM | POA: Diagnosis not present

## 2023-04-30 DIAGNOSIS — Z1331 Encounter for screening for depression: Secondary | ICD-10-CM | POA: Diagnosis not present

## 2023-04-30 DIAGNOSIS — I7121 Aneurysm of the ascending aorta, without rupture: Secondary | ICD-10-CM | POA: Diagnosis not present

## 2023-04-30 DIAGNOSIS — Z139 Encounter for screening, unspecified: Secondary | ICD-10-CM | POA: Diagnosis not present

## 2023-04-30 DIAGNOSIS — I10 Essential (primary) hypertension: Secondary | ICD-10-CM | POA: Diagnosis not present

## 2023-04-30 DIAGNOSIS — N183 Chronic kidney disease, stage 3 unspecified: Secondary | ICD-10-CM | POA: Diagnosis not present

## 2023-04-30 DIAGNOSIS — Z9181 History of falling: Secondary | ICD-10-CM | POA: Diagnosis not present

## 2023-04-30 DIAGNOSIS — R7303 Prediabetes: Secondary | ICD-10-CM | POA: Diagnosis not present

## 2023-04-30 DIAGNOSIS — M47816 Spondylosis without myelopathy or radiculopathy, lumbar region: Secondary | ICD-10-CM | POA: Diagnosis not present

## 2023-08-04 DIAGNOSIS — R5383 Other fatigue: Secondary | ICD-10-CM | POA: Diagnosis not present

## 2023-08-04 DIAGNOSIS — M47816 Spondylosis without myelopathy or radiculopathy, lumbar region: Secondary | ICD-10-CM | POA: Diagnosis not present

## 2023-08-04 DIAGNOSIS — R7303 Prediabetes: Secondary | ICD-10-CM | POA: Diagnosis not present

## 2023-08-04 DIAGNOSIS — N183 Chronic kidney disease, stage 3 unspecified: Secondary | ICD-10-CM | POA: Diagnosis not present

## 2023-08-04 DIAGNOSIS — E559 Vitamin D deficiency, unspecified: Secondary | ICD-10-CM | POA: Diagnosis not present

## 2023-08-04 DIAGNOSIS — I10 Essential (primary) hypertension: Secondary | ICD-10-CM | POA: Diagnosis not present

## 2023-08-04 DIAGNOSIS — I7121 Aneurysm of the ascending aorta, without rupture: Secondary | ICD-10-CM | POA: Diagnosis not present

## 2023-09-03 DIAGNOSIS — E538 Deficiency of other specified B group vitamins: Secondary | ICD-10-CM | POA: Diagnosis not present

## 2023-09-03 DIAGNOSIS — N183 Chronic kidney disease, stage 3 unspecified: Secondary | ICD-10-CM | POA: Diagnosis not present

## 2023-11-18 DIAGNOSIS — I10 Essential (primary) hypertension: Secondary | ICD-10-CM | POA: Diagnosis not present

## 2023-11-18 DIAGNOSIS — E538 Deficiency of other specified B group vitamins: Secondary | ICD-10-CM | POA: Diagnosis not present

## 2023-11-18 DIAGNOSIS — N1832 Chronic kidney disease, stage 3b: Secondary | ICD-10-CM | POA: Diagnosis not present

## 2023-11-18 DIAGNOSIS — Z23 Encounter for immunization: Secondary | ICD-10-CM | POA: Diagnosis not present

## 2023-11-18 DIAGNOSIS — H6123 Impacted cerumen, bilateral: Secondary | ICD-10-CM | POA: Diagnosis not present

## 2023-11-18 DIAGNOSIS — R7303 Prediabetes: Secondary | ICD-10-CM | POA: Diagnosis not present

## 2023-11-18 DIAGNOSIS — I7121 Aneurysm of the ascending aorta, without rupture: Secondary | ICD-10-CM | POA: Diagnosis not present

## 2023-11-18 DIAGNOSIS — E559 Vitamin D deficiency, unspecified: Secondary | ICD-10-CM | POA: Diagnosis not present

## 2023-11-18 DIAGNOSIS — M47816 Spondylosis without myelopathy or radiculopathy, lumbar region: Secondary | ICD-10-CM | POA: Diagnosis not present

## 2023-12-17 DIAGNOSIS — H6123 Impacted cerumen, bilateral: Secondary | ICD-10-CM | POA: Diagnosis not present

## 2024-01-16 DIAGNOSIS — H52223 Regular astigmatism, bilateral: Secondary | ICD-10-CM | POA: Diagnosis not present

## 2024-01-16 DIAGNOSIS — H5203 Hypermetropia, bilateral: Secondary | ICD-10-CM | POA: Diagnosis not present

## 2024-01-16 DIAGNOSIS — H04123 Dry eye syndrome of bilateral lacrimal glands: Secondary | ICD-10-CM | POA: Diagnosis not present
